# Patient Record
Sex: Male | Born: 1945 | Race: White | Hispanic: No | Marital: Married | Smoking: Former smoker
Health system: Southern US, Community
[De-identification: ages and names within clinical notes are randomized; demographics above are authoritative.]

## PROBLEM LIST (undated history)

## (undated) DIAGNOSIS — M1990 Unspecified osteoarthritis, unspecified site: Secondary | ICD-10-CM

## (undated) DIAGNOSIS — I491 Atrial premature depolarization: Secondary | ICD-10-CM

## (undated) DIAGNOSIS — G4730 Sleep apnea, unspecified: Secondary | ICD-10-CM

## (undated) DIAGNOSIS — Z87442 Personal history of urinary calculi: Secondary | ICD-10-CM

## (undated) DIAGNOSIS — E785 Hyperlipidemia, unspecified: Secondary | ICD-10-CM

## (undated) DIAGNOSIS — K219 Gastro-esophageal reflux disease without esophagitis: Secondary | ICD-10-CM

## (undated) DIAGNOSIS — K469 Unspecified abdominal hernia without obstruction or gangrene: Secondary | ICD-10-CM

## (undated) DIAGNOSIS — Z8719 Personal history of other diseases of the digestive system: Secondary | ICD-10-CM

## (undated) DIAGNOSIS — Z973 Presence of spectacles and contact lenses: Secondary | ICD-10-CM

## (undated) DIAGNOSIS — N201 Calculus of ureter: Secondary | ICD-10-CM

## (undated) DIAGNOSIS — K5730 Diverticulosis of large intestine without perforation or abscess without bleeding: Secondary | ICD-10-CM

## (undated) HISTORY — PX: NASAL SINUS SURGERY: SHX719

## (undated) HISTORY — PX: TONSILLECTOMY: SUR1361

## (undated) HISTORY — DX: Atrial premature depolarization: I49.1

## (undated) HISTORY — DX: Sleep apnea, unspecified: G47.30

## (undated) HISTORY — DX: Unspecified abdominal hernia without obstruction or gangrene: K46.9

## (undated) HISTORY — PX: WRIST SURGERY: SHX841

## (undated) HISTORY — PX: OTHER SURGICAL HISTORY: SHX169

## (undated) HISTORY — DX: Gastro-esophageal reflux disease without esophagitis: K21.9

---

## 1993-02-09 HISTORY — PX: LAPAROSCOPIC NISSEN FUNDOPLICATION: SHX1932

## 1996-02-10 HISTORY — PX: HIATAL HERNIA REPAIR: SHX195

## 2000-05-20 HISTORY — PX: CARDIOVASCULAR STRESS TEST: SHX262

## 2003-06-01 HISTORY — PX: COLONOSCOPY: SHX174

## 2003-12-05 ENCOUNTER — Inpatient Hospital Stay (HOSPITAL_COMMUNITY): Admission: AD | Admit: 2003-12-05 | Discharge: 2003-12-09 | Payer: Self-pay | Admitting: Orthopedic Surgery

## 2003-12-06 HISTORY — PX: OTHER SURGICAL HISTORY: SHX169

## 2005-03-10 ENCOUNTER — Ambulatory Visit: Payer: Self-pay | Admitting: Internal Medicine

## 2005-03-13 ENCOUNTER — Ambulatory Visit: Payer: Self-pay | Admitting: Internal Medicine

## 2005-03-23 ENCOUNTER — Ambulatory Visit: Payer: Self-pay | Admitting: Internal Medicine

## 2005-04-13 ENCOUNTER — Ambulatory Visit: Payer: Self-pay | Admitting: Internal Medicine

## 2006-04-12 ENCOUNTER — Ambulatory Visit: Payer: Self-pay | Admitting: Internal Medicine

## 2006-04-12 LAB — CONVERTED CEMR LAB
AST: 20 units/L (ref 0–37)
Bilirubin, Direct: 0.1 mg/dL (ref 0.0–0.3)
Chloride: 109 meq/L (ref 96–112)
Creatinine, Ser: 1 mg/dL (ref 0.4–1.5)
Eosinophils Relative: 4.8 % (ref 0.0–5.0)
Glucose, Bld: 94 mg/dL (ref 70–99)
HCT: 42.7 % (ref 39.0–52.0)
Hemoglobin: 14.6 g/dL (ref 13.0–17.0)
LDL Cholesterol: 127 mg/dL — ABNORMAL HIGH (ref 0–99)
MCV: 86.8 fL (ref 78.0–100.0)
Monocytes Absolute: 0.4 10*3/uL (ref 0.2–0.7)
Neutrophils Relative %: 51.7 % (ref 43.0–77.0)
PSA: 0.75 ng/mL (ref 0.10–4.00)
RBC: 4.93 M/uL (ref 4.22–5.81)
RDW: 12.6 % (ref 11.5–14.6)
Sodium: 145 meq/L (ref 135–145)
Total Bilirubin: 0.7 mg/dL (ref 0.3–1.2)
Total CHOL/HDL Ratio: 5.2
Total Protein: 6.9 g/dL (ref 6.0–8.3)
WBC: 5.1 10*3/uL (ref 4.5–10.5)

## 2006-11-11 DIAGNOSIS — Z8709 Personal history of other diseases of the respiratory system: Secondary | ICD-10-CM | POA: Insufficient documentation

## 2008-10-03 ENCOUNTER — Telehealth: Payer: Self-pay | Admitting: *Deleted

## 2008-10-10 ENCOUNTER — Ambulatory Visit: Payer: Self-pay | Admitting: Internal Medicine

## 2008-10-10 LAB — CONVERTED CEMR LAB
ALT: 16 units/L (ref 0–53)
AST: 21 units/L (ref 0–37)
Alkaline Phosphatase: 83 units/L (ref 39–117)
Basophils Relative: 0 % (ref 0.0–3.0)
Bilirubin, Direct: 0 mg/dL (ref 0.0–0.3)
Chloride: 108 meq/L (ref 96–112)
Creatinine, Ser: 1 mg/dL (ref 0.4–1.5)
Eosinophils Relative: 3.9 % (ref 0.0–5.0)
LDL Cholesterol: 123 mg/dL — ABNORMAL HIGH (ref 0–99)
Lymphocytes Relative: 34.4 % (ref 12.0–46.0)
MCV: 89.3 fL (ref 78.0–100.0)
Monocytes Absolute: 0.4 10*3/uL (ref 0.1–1.0)
Neutrophils Relative %: 54.5 % (ref 43.0–77.0)
Nitrite: NEGATIVE
Potassium: 3.8 meq/L (ref 3.5–5.1)
RBC: 5.08 M/uL (ref 4.22–5.81)
Total CHOL/HDL Ratio: 5
Total Protein: 7.3 g/dL (ref 6.0–8.3)
Triglycerides: 126 mg/dL (ref 0.0–149.0)
Urobilinogen, UA: 0.2
WBC Urine, dipstick: NEGATIVE
WBC: 6 10*3/uL (ref 4.5–10.5)

## 2008-10-25 ENCOUNTER — Ambulatory Visit: Payer: Self-pay | Admitting: Internal Medicine

## 2008-10-25 DIAGNOSIS — E785 Hyperlipidemia, unspecified: Secondary | ICD-10-CM | POA: Insufficient documentation

## 2009-10-15 ENCOUNTER — Encounter: Payer: Self-pay | Admitting: Internal Medicine

## 2010-03-11 NOTE — Consult Note (Signed)
Summary: [facility]   Imported By: Maryln Gottron 10/22/2009 10:30:12  _____________________________________________________________________  External Attachment:    Type:   Image     Comment:   External Document

## 2010-06-27 NOTE — Op Note (Signed)
NAMEMARICELA, Shawn Graham                 ACCOUNT NO.:  [id number]   MEDICAL RECORD NO.:  [id number]          PATIENT TYPE:  INP   LOCATION:  5727                         FACILITY:  [facility]   PHYSICIAN:  Dionne Ano. Gramig III, M.D.DATE OF BIRTH:  Mar 12, 1945   DATE OF PROCEDURE:  12/06/2003  DATE OF DISCHARGE:                                 OPERATIVE REPORT   PREOPERATIVE DIAGNOSIS:  Status post saw laceration of the left index finger  with nerve and tendon injury as well as early cellulitis process, status  post initial irrigation and debridement 24 hours ago.   POSTOPERATIVE DIAGNOSIS:  Status post saw laceration of the left index  finger with nerve and tendon injury as well as early cellulitis process,  status post initial irrigation and debridement 24 hours ago.   PROCEDURES:  1.  Irrigation and debridement of left index finger skin, subcutaneous      tissue, tendon, and periosteal tissue.  2.  Repair of flexor digitorum profundus in zone 2, left index finger, with      four-strand 4-0 Fibrewire technique.  3.  Debride flexor digitorum superficialis, zone 2, left index finger.  4.  Left index finger ulnar digital nerve repair.  5.  Left index finger radial digital nerve repair.  Both digital nerves were      repaired in zone 2, I should note.  6.  Microscope use for the digital nerve repairs.   SURGEON:  Dionne Ano. Amanda Pea, M.D.   ASSISTANT:  Karie Chimera, P.A.-C.   COMPLICATIONS:  None.   ANESTHESIA:  General.   TOURNIQUET TIME:  Less than 45 minutes.   CULTURES:  Aerobic and anaerobic taken.   INDICATION FOR PROCEDURE:  This patient is a 65 year old male who presents  with the above-mentioned diagnosis.  He was seen at [facility] and  underwent closure of his wound Monday.  He presented to my office Wednesday  with worsening cellulitis and inability to move the finger.  He was I&D'd  immediately and cultures were taken, and he was admitted to the hospital.  Subsequent to this the patient presents for repeat I&D and repair of all  structures as necessary.  He understands all risks and benefits, etc.   OPERATION IN DETAIL:  The patient was seen by myself and anesthesia, taken  to the operative suite, and underwent a smooth induction of general  anesthesia.  He was noted to be on Unasyn preoperatively as ordered by  myself.  Once in the operative suite he underwent prep and drape in the  usual sterile fashion about the left upper extremity with Betadine scrub and  paint.  The patient had the sterile field secured with drapes and once this  was done underwent I&D of skin, subcutaneous tissue, tendon tissue, and  periosteal tissue.  He had severe injury to the A4 pulley.  Periosteal  tissue was involved, but there was no bony fracture.  The patient had a  radial and ulnar digital nerve laceration.  He did have refill to the finger  preoperatively and postoperatively.  He had extensive FDP tearing  with  retraction of the tendon and in addition this, FDS was frayed.  He underwent  debridement without difficulty, and copious amounts of saline were placed in  the wound.  I did irrigate the sheath rather vigorously without difficulty.  Following this the patient then had debridement of the flexor digitorum  superficialis in zone 2 about the left index finger.  Following this he  underwent a four-strand repair with 4-0 Fibrewire in zone 2 about the flexor  digitorum profundus tendon.  A 6-0 nylon was used for an epitenon repair.  The A4 pulley was completely obliterated and torn.  The A2 pulley was  intact.  The A4 pulley was then able to be reconstructed as it had a large  amount of significant loss.  Following this, the patient then underwent  evaluation of the digital nerves.  The microscope was brought in and the  patient underwent left index finger ulnar digital nerve repair with  epineurial technique.  The patient then underwent radial digital  nerve  repair at the DIP region in the region of the trifurcation of the nerve.  This was once again done with 8-0 nylon in an epineurial fashion.  Both the  radial and ulnar digital nerves were repaired under the microscope without  difficulty, and there were no complicating features.  Once the repairs were  accomplished, the patient then had further copious irrigation.  The patient  had the digital nerve repairs and flexor tendon repair done with the  tourniquet down to ensure that his refill was adequate.  I was pleased with  this.  Following this I then debrided any prenecrotic tissue and then closed  the wound over a drain.  A vessel loop drain was placed and Prolene suture  was used to loosely close the wound.  No tendon or nerve repairs were  exposed to the outside environment, and the finger closed relatively well.  I was pleased with this and the findings.  Given the fact that he had no  frank pus and improving findings, we did feel it necessary to go ahead and  perform the nerve and tendon repairs.  I have discussed this with his wife.   I feel he has a long road ahead of him, but certainly we will be aggressive  in trying to restore him to quiescence.  I have given them no guarantees,  and we will need to monitor the wound closely.  We did take aerobic and  anaerobic cultures at the outset of the case.  These were sent.  His  tourniquet time was certainly less than 45 minutes.  There were no  complicating features.  He had good refill and had a sterile dressing with  dorsal blocking splint applied at the end of the case.  He was transferred  to the recovery room in stable condition.  All sponge, needle, and  instrument counts were reported as correct.  He will be monitored and  continued as an inpatient in the hospital.  I have discussed all care issues  and plans with his wife.       WMG/MEDQ  D:  12/06/2003  T:  12/07/2003  Job:  [id number]

## 2010-06-27 NOTE — Discharge Summary (Signed)
NAMEKEITH, FELTEN NO.:  [id number]   MEDICAL RECORD NO.:  [id number]          PATIENT TYPE:  INP   LOCATION:  5025                         FACILITY:  [facility]   PHYSICIAN:  Karie Chimera, P.A.-C.DATE OF BIRTH:  [birth year]   DATE OF ADMISSION:  12/05/2003  DATE OF DISCHARGE:  12/09/2003                                 DISCHARGE SUMMARY   ADMISSION DIAGNOSIS:  Left index finger laceration sustaining FDP, FDS  injury as well as neurological deficit with ascending cellulitis noted.   DISCHARGE DIAGNOSIS:  Left index finger laceration sustaining FDP, FDS  injury as well as neurological deficit with ascending cellulitis noted.   SURGEON:  Dionne Ano. Amanda Pea, M.D.   CONSULTATIONS:  None.   BRIEF HISTORY PRESENT ILLNESS:  Mr. Lindholm was a pleasant 65 year old  gentleman who sustained a laceration to his left index finger on Monday  November 24.  He was originally seen and evaluated at the [facility]  Emergency Room where he was placed on antibiotics and pain medications.  The  patient noted lack of sensation to the tip of the finger as well as lack of  motion.  Over the next two days, he noticed increased swelling, redness, and  pain and was seen and evaluated by Dr. Amanda Pea and upper extremity  specialist.  He was found to have a left index finger laceration with  neurological deficit as well as tendon deficit and soft tissue infection.  Given the nature of his injury, the patient was admitted for an I&D and  repair of structures as necessary.  It was noted the patient had been  previously placed on Keflex which would certainly__________the  intraoperative cultures.   HOSPITAL COURSE:  The patient was admitted on December 05, 2003 and was  started on Unasyn 3 g IV q.6h as well as appropriate pain management.  He  was elevated in a mission sling and monitored carefully.  Once soft tissue  conditions improved somewhat or were more improved, on December 06, 2003  the  patient underwent an I&D of the left index finger as well as repair of the  FDPs zone 2, debridement of FDS zone 2.  He underwent a left index finger  ulnar digital nerve and radial digital nerve repair utilizing the  microscope.  Intraoperative cultures were obtained.  The patient was  continued to watch closely.  On December 07, 2003, his vital signs were  stable.  Temperature max was 99.8.  His dressings were clean, dry, and  intact.  He continued receiving IV antibiotics and appropriate pain  management.  On postoperative day #2, he was alert and oriented.  Vital  signs were stable.  Cultures showed no growth to date.  Temperature max was  99.1.  The patient overall continued to improve and on December 09, 2003 his  dressings were removed.  He had noted improvement in his soft tissue  swelling and erythema.  He had excellent refill noted.  His dressings were  changed at bedside.  His vital signs were stable.  His cultures showed no  growth to date.  Due to the patient's improvement, a decision was made to  discharge him home.   ADMISSION DIAGNOSIS:  Left index finger laceration with tendinous  neurological deficit and soft tissue cellulitis.   CONDITION ON DISCHARGE:  Improved.   DISPOSITION:  Home to care of family.   ACTIVITY:  He will keep his dressings clean, dry, and intact.   DISCHARGE MEDICATIONS:  1.  Percocet #50, 1-2 p.o. q.4-6h p.r.n. pain.  2.  Robaxin #30, one p.o. q.6-8h with one refill.  3.  Augmentin 875 one p.o. b.i.d. x2 weeks.   In terms of followup, the patient will call the office tomorrow to arrange  therapy and followup with Dr. Amanda Pea.       BB/MEDQ  D:  01/15/2004  T:  01/15/2004  Job:  [id number]

## 2013-04-30 ENCOUNTER — Emergency Department (HOSPITAL_COMMUNITY): Payer: Medicare Other

## 2013-04-30 ENCOUNTER — Encounter (HOSPITAL_COMMUNITY): Payer: Medicare Other | Admitting: Anesthesiology

## 2013-04-30 ENCOUNTER — Encounter (HOSPITAL_COMMUNITY): Payer: Self-pay | Admitting: Emergency Medicine

## 2013-04-30 ENCOUNTER — Emergency Department (HOSPITAL_COMMUNITY): Payer: Medicare Other | Admitting: Anesthesiology

## 2013-04-30 ENCOUNTER — Inpatient Hospital Stay (HOSPITAL_COMMUNITY)
Admission: EM | Admit: 2013-04-30 | Discharge: 2013-05-01 | DRG: 669 | Disposition: A | Discharge status: Home / Self Care | Payer: Medicare Other | Attending: Urology | Admitting: Urology

## 2013-04-30 ENCOUNTER — Encounter (HOSPITAL_COMMUNITY)
Admission: EM | Disposition: A | Discharge status: Home / Self Care | Payer: Self-pay | Source: Home / Self Care | Attending: Urology

## 2013-04-30 DIAGNOSIS — N329 Bladder disorder, unspecified: Secondary | ICD-10-CM | POA: Diagnosis present

## 2013-04-30 DIAGNOSIS — N201 Calculus of ureter: Principal | ICD-10-CM | POA: Diagnosis present

## 2013-04-30 DIAGNOSIS — Z87891 Personal history of nicotine dependence: Secondary | ICD-10-CM

## 2013-04-30 DIAGNOSIS — N179 Acute kidney failure, unspecified: Secondary | ICD-10-CM | POA: Diagnosis present

## 2013-04-30 DIAGNOSIS — N390 Urinary tract infection, site not specified: Secondary | ICD-10-CM | POA: Diagnosis present

## 2013-04-30 DIAGNOSIS — R509 Fever, unspecified: Secondary | ICD-10-CM

## 2013-04-30 HISTORY — PX: CYSTOSCOPY W/ URETERAL STENT PLACEMENT: SHX1429

## 2013-04-30 LAB — URINALYSIS, ROUTINE W REFLEX MICROSCOPIC
BILIRUBIN URINE: NEGATIVE
Glucose, UA: NEGATIVE mg/dL
KETONES UR: 15 mg/dL — AB
Nitrite: NEGATIVE
PROTEIN: 100 mg/dL — AB
Specific Gravity, Urine: 1.019 (ref 1.005–1.030)
UROBILINOGEN UA: 1 mg/dL (ref 0.0–1.0)
pH: 6 (ref 5.0–8.0)

## 2013-04-30 LAB — CBC WITH DIFFERENTIAL/PLATELET
BASOS ABS: 0 10*3/uL (ref 0.0–0.1)
Basophils Relative: 0 % (ref 0–1)
EOS PCT: 2 % (ref 0–5)
Eosinophils Absolute: 0.2 10*3/uL (ref 0.0–0.7)
HCT: 37.6 % — ABNORMAL LOW (ref 39.0–52.0)
Hemoglobin: 12.8 g/dL — ABNORMAL LOW (ref 13.0–17.0)
Lymphocytes Relative: 17 % (ref 12–46)
Lymphs Abs: 1.6 10*3/uL (ref 0.7–4.0)
MCH: 29.4 pg (ref 26.0–34.0)
MCHC: 34 g/dL (ref 30.0–36.0)
MCV: 86.4 fL (ref 78.0–100.0)
Monocytes Absolute: 1 10*3/uL (ref 0.1–1.0)
Monocytes Relative: 10 % (ref 3–12)
Neutro Abs: 7 10*3/uL (ref 1.7–7.7)
Neutrophils Relative %: 71 % (ref 43–77)
PLATELETS: 185 10*3/uL (ref 150–400)
RBC: 4.35 MIL/uL (ref 4.22–5.81)
RDW: 12.6 % (ref 11.5–15.5)
WBC: 9.8 10*3/uL (ref 4.0–10.5)

## 2013-04-30 LAB — URINE MICROSCOPIC-ADD ON

## 2013-04-30 LAB — BASIC METABOLIC PANEL
BUN: 17 mg/dL (ref 6–23)
CALCIUM: 9.1 mg/dL (ref 8.4–10.5)
CO2: 24 mEq/L (ref 19–32)
Chloride: 102 mEq/L (ref 96–112)
Creatinine, Ser: 1.79 mg/dL — ABNORMAL HIGH (ref 0.50–1.35)
GFR calc Af Amer: 43 mL/min — ABNORMAL LOW (ref >90)
GFR calc non Af Amer: 37 mL/min — ABNORMAL LOW (ref >90)
Glucose, Bld: 108 mg/dL — ABNORMAL HIGH (ref 70–99)
Potassium: 4 mEq/L (ref 3.7–5.3)
SODIUM: 140 meq/L (ref 137–147)

## 2013-04-30 SURGERY — CYSTOSCOPY, WITH RETROGRADE PYELOGRAM AND URETERAL STENT INSERTION
Anesthesia: General | Laterality: Right

## 2013-04-30 MED ORDER — DEXAMETHASONE SODIUM PHOSPHATE 10 MG/ML IJ SOLN
INTRAMUSCULAR | Status: DC | PRN
  Administered 2013-04-30: 10 mg via INTRAVENOUS

## 2013-04-30 MED ORDER — DEXTROSE 5 % IV SOLN: 1.0000 g | INTRAVENOUS | Status: DC

## 2013-04-30 MED ORDER — FENTANYL CITRATE 0.05 MG/ML IJ SOLN
INTRAMUSCULAR | Status: DC | PRN
  Administered 2013-04-30 (×2): 50 ug via INTRAVENOUS

## 2013-04-30 MED ORDER — MIDAZOLAM HCL 2 MG/2ML IJ SOLN
INTRAMUSCULAR | Status: AC
  Filled 2013-04-30: qty 2

## 2013-04-30 MED ORDER — SODIUM CHLORIDE 0.9 % IR SOLN
Status: DC | PRN
  Administered 2013-04-30: 3000 mL

## 2013-04-30 MED ORDER — SENNA 8.6 MG PO TABS
1.0000 | ORAL_TABLET | Freq: Two times a day (BID) | ORAL | Status: DC
  Administered 2013-04-30: 8.6 mg via ORAL
  Filled 2013-04-30: qty 1

## 2013-04-30 MED ORDER — KETOROLAC TROMETHAMINE 30 MG/ML IJ SOLN: 15.0000 mg | Freq: Once | INTRAMUSCULAR | Status: DC | PRN

## 2013-04-30 MED ORDER — PROPOFOL 10 MG/ML IV BOLUS
INTRAVENOUS | Status: AC
  Filled 2013-04-30: qty 20

## 2013-04-30 MED ORDER — DEXTROSE 5 % IV SOLN
1.0000 g | Freq: Once | INTRAVENOUS | Status: AC
  Administered 2013-04-30: 1 g via INTRAVENOUS
  Filled 2013-04-30: qty 10

## 2013-04-30 MED ORDER — MEPERIDINE HCL 50 MG/ML IJ SOLN: 6.2500 mg | INTRAMUSCULAR | Status: DC | PRN

## 2013-04-30 MED ORDER — PROPOFOL 10 MG/ML IV BOLUS
INTRAVENOUS | Status: DC | PRN
  Administered 2013-04-30: 200 mg via INTRAVENOUS
  Administered 2013-04-30: 50 mg via INTRAVENOUS

## 2013-04-30 MED ORDER — STERILE WATER FOR IRRIGATION IR SOLN
Status: DC | PRN
  Administered 2013-04-30: 3000 mL

## 2013-04-30 MED ORDER — DOCUSATE SODIUM 100 MG PO CAPS
100.0000 mg | ORAL_CAPSULE | Freq: Two times a day (BID) | ORAL | Status: DC
  Administered 2013-04-30 – 2013-05-01 (×2): 100 mg via ORAL
  Filled 2013-04-30 (×3): qty 1

## 2013-04-30 MED ORDER — OXYCODONE HCL 5 MG PO TABS: 5.0000 mg | ORAL_TABLET | ORAL | Status: DC | PRN

## 2013-04-30 MED ORDER — ONDANSETRON HCL 4 MG/2ML IJ SOLN
INTRAMUSCULAR | Status: DC | PRN
  Administered 2013-04-30: 4 mg via INTRAVENOUS

## 2013-04-30 MED ORDER — DEXAMETHASONE SODIUM PHOSPHATE 10 MG/ML IJ SOLN
INTRAMUSCULAR | Status: AC
  Filled 2013-04-30: qty 1

## 2013-04-30 MED ORDER — LORATADINE 10 MG PO TABS
10.0000 mg | ORAL_TABLET | Freq: Every day | ORAL | Status: DC
  Administered 2013-04-30: 10 mg via ORAL
  Filled 2013-04-30 (×2): qty 1

## 2013-04-30 MED ORDER — ONDANSETRON HCL 4 MG/2ML IJ SOLN: 4.0000 mg | INTRAMUSCULAR | Status: DC | PRN

## 2013-04-30 MED ORDER — HYDROMORPHONE HCL PF 1 MG/ML IJ SOLN: 0.5000 mg | INTRAMUSCULAR | Status: DC | PRN

## 2013-04-30 MED ORDER — SUCCINYLCHOLINE CHLORIDE 20 MG/ML IJ SOLN
INTRAMUSCULAR | Status: DC | PRN
  Administered 2013-04-30: 100 mg via INTRAVENOUS

## 2013-04-30 MED ORDER — METOCLOPRAMIDE HCL 5 MG/ML IJ SOLN: 10.0000 mg | Freq: Once | INTRAMUSCULAR | Status: DC | PRN

## 2013-04-30 MED ORDER — IOHEXOL 300 MG/ML  SOLN
INTRAMUSCULAR | Status: DC | PRN
  Administered 2013-04-30: 50 mL via URETHRAL

## 2013-04-30 MED ORDER — TAMSULOSIN HCL 0.4 MG PO CAPS
0.4000 mg | ORAL_CAPSULE | Freq: Every day | ORAL | Status: DC
  Administered 2013-04-30 – 2013-05-01 (×2): 0.4 mg via ORAL
  Filled 2013-04-30 (×2): qty 1

## 2013-04-30 MED ORDER — SODIUM CHLORIDE 0.9 % IV SOLN
INTRAVENOUS | Status: DC
  Administered 2013-04-30: 21:00:00 via INTRAVENOUS

## 2013-04-30 MED ORDER — LACTATED RINGERS IV SOLN
INTRAVENOUS | Status: DC | PRN
  Administered 2013-04-30: 20:00:00 via INTRAVENOUS

## 2013-04-30 MED ORDER — FENTANYL CITRATE 0.05 MG/ML IJ SOLN: 25.0000 ug | INTRAMUSCULAR | Status: DC | PRN

## 2013-04-30 MED ORDER — ONDANSETRON HCL 4 MG/2ML IJ SOLN
INTRAMUSCULAR | Status: AC
  Filled 2013-04-30: qty 2

## 2013-04-30 MED ORDER — SODIUM CHLORIDE 0.9 % IV BOLUS (SEPSIS)
1000.0000 mL | Freq: Once | INTRAVENOUS | Status: AC
  Administered 2013-04-30: 1000 mL via INTRAVENOUS

## 2013-04-30 MED ORDER — FENTANYL CITRATE 0.05 MG/ML IJ SOLN
INTRAMUSCULAR | Status: AC
  Filled 2013-04-30: qty 2

## 2013-04-30 MED ORDER — MIDAZOLAM HCL 5 MG/5ML IJ SOLN
INTRAMUSCULAR | Status: DC | PRN
  Administered 2013-04-30: 2 mg via INTRAVENOUS

## 2013-04-30 MED ORDER — LIDOCAINE HCL (CARDIAC) 20 MG/ML IV SOLN
INTRAVENOUS | Status: DC | PRN
  Administered 2013-04-30: 100 mg via INTRAVENOUS

## 2013-04-30 SURGICAL SUPPLY — 11 items
CATH URET 5FR 28IN OPEN ENDED (CATHETERS) ×2 IMPLANT
DRAPE CAMERA CLOSED 9X96 (DRAPES) ×2 IMPLANT
GLOVE BIO SURGEON STRL SZ7.5 (GLOVE) ×2 IMPLANT
GLOVE SURG SS PI 8.5 STRL IVOR (GLOVE) ×4
GLOVE SURG SS PI 8.5 STRL STRW (GLOVE) IMPLANT
GOWN STRL REUS W/TWL 2XL LVL3 (GOWN DISPOSABLE) ×2 IMPLANT
GOWN STRL REUS W/TWL XL LVL3 (GOWN DISPOSABLE) ×2 IMPLANT
GUIDEWIRE ANG ZIPWIRE 038X150 (WIRE) ×2 IMPLANT
GUIDEWIRE STR DUAL SENSOR (WIRE) ×2 IMPLANT
PACK CYSTO (CUSTOM PROCEDURE TRAY) ×2 IMPLANT
STENT POLARIS 5FRX26 (STENTS) ×2 IMPLANT

## 2013-04-30 NOTE — ED Notes (Signed)
Pt given urinal and made aware of need for urine specimen 

## 2013-04-30 NOTE — ED Notes (Signed)
Pt told has kidney stone on Wed and saw urologist and was told he has until Friday to pass it. Pt started feeling horrible today with fevers and called urologist on call and was told to come in to ED and he would come and see. Possibly place stent today.

## 2013-04-30 NOTE — Anesthesia Postprocedure Evaluation (Signed)
  Anesthesia Post-op Note  Anesthesia Post Note  Patient: Shawn Graham  Procedure(s) Performed: Procedure(s) (LRB): CYSTOSCOPY WITH RETROGRADE PYELOGRAM/URETERAL STENT PLACEMENT (Right)  Anesthesia type: General  Patient location: PACU  Post pain: Pain level controlled  Post assessment: Post-op Vital signs reviewed  Last Vitals:  Filed Vitals:   04/30/13 2030  BP: 122/67  Pulse: 80  Temp:   Resp: 19    Post vital signs: Reviewed  Level of consciousness: sedated  Complications: No apparent anesthesia complications

## 2013-04-30 NOTE — Anesthesia Preprocedure Evaluation (Addendum)
Anesthesia Evaluation  Patient identified by MRN, date of birth, ID band Patient awake    Reviewed: Allergy & Precautions, H&P , NPO status , Patient's Chart, lab work & pertinent test results, reviewed documented beta blocker date and time   History of Anesthesia Complications Negative for: history of anesthetic complications  Airway Mallampati: I TM Distance: >3 FB Neck ROM: full    Dental  (+) Teeth Intact   Pulmonary neg pulmonary ROS, former smoker,  breath sounds clear to auscultation  Pulmonary exam normal       Cardiovascular Exercise Tolerance: Good negative cardio ROS  Rhythm:regular Rate:Normal     Neuro/Psych negative neurological ROS  negative psych ROS   GI/Hepatic negative GI ROS, Neg liver ROS,   Endo/Other  negative endocrine ROS  Renal/GU Renal stone - taking flomax and oxycodone since Wednesday.  Today with fever to 101.7  negative genitourinary   Musculoskeletal   Abdominal   Peds  Hematology negative hematology ROS (+)   Anesthesia Other Findings Last ate yogurt at 1 pm today  Reproductive/Obstetrics negative OB ROS                          Anesthesia Physical Anesthesia Plan  ASA: II and emergent  Anesthesia Plan: General ETT   Post-op Pain Management:    Induction:   Airway Management Planned:   Additional Equipment:   Intra-op Plan:   Post-operative Plan:   Informed Consent: I have reviewed the patients History and Physical, chart, labs and discussed the procedure including the risks, benefits and alternatives for the proposed anesthesia with the patient or authorized representative who has indicated his/her understanding and acceptance.   Dental Advisory Given  Plan Discussed with: CRNA and Surgeon  Anesthesia Plan Comments:        Anesthesia Quick Evaluation

## 2013-04-30 NOTE — H&P (Signed)
Shawn Graham is an 68 y.o. male.    Chief Complaint: Rt Ureteral Stone, Flank Pain, Fever, Acute Renal Failure  HPI:   1 - Rt Ureteral Stone, Flank Pain, Fever, Acute Renal Failure -  Pt with Rt ureteral stone 69mm proximal by CT at [facility] (report avail, images not) on w/u colicky flank pain on 9/37/16. Seen by [facility] at [facility] 3/20 with tentative plan for initial medical therapy. Subsequently developed fevers today to 101.5 with diaphoresis, severe malaise at home and febrile in ER to 100.8. No other localizing symptoms. Cr today in ER 1.78, unknown baseline, but denies renal insufficiency. UA from 3/20 w/o infectious parameters. UA today not collected but pt already started on rocephin.  Last meal bit of yogurt at 1pm. KUB today unable to visualize stone due to bowel gas pattern, but he denies interval passage.  PMH sig for hiateal hernia repair, sinus surgery. No CV disease. No strong blood thinners.   Today Shawn Graham is seen as ER visit urgently for above.   History reviewed. No pertinent past medical history.  Past Surgical History  Procedure Laterality Date  . Hernia repair    . Nasal sinus surgery      No family history on file. Social History:  reports that he has quit smoking. He does not have any smokeless tobacco history on file. He reports that he does not drink alcohol. His drug history is not on file.  Allergies: No Known Allergies   (Not in a hospital admission)  Results for orders placed during the hospital encounter of 04/30/13 (from the past 48 hour(s))  CBC WITH DIFFERENTIAL     Status: Abnormal   Collection Time    04/30/13  5:30 PM      Result Value Ref Range   WBC 9.8  4.0 - 10.5 K/uL   RBC 4.35  4.22 - 5.81 MIL/uL   Hemoglobin 12.8 (*) 13.0 - 17.0 g/dL   HCT 37.6 (*) 39.0 - 52.0 %   MCV 86.4  78.0 - 100.0 fL   MCH 29.4  26.0 - 34.0 pg   MCHC 34.0  30.0 - 36.0 g/dL   RDW 12.6  11.5 - 15.5 %   Platelets 185  150 - 400 K/uL   Neutrophils Relative %  71  43 - 77 %   Neutro Abs 7.0  1.7 - 7.7 K/uL   Lymphocytes Relative 17  12 - 46 %   Lymphs Abs 1.6  0.7 - 4.0 K/uL   Monocytes Relative 10  3 - 12 %   Monocytes Absolute 1.0  0.1 - 1.0 K/uL   Eosinophils Relative 2  0 - 5 %   Eosinophils Absolute 0.2  0.0 - 0.7 K/uL   Basophils Relative 0  0 - 1 %   Basophils Absolute 0.0  0.0 - 0.1 K/uL  BASIC METABOLIC PANEL     Status: Abnormal   Collection Time    04/30/13  5:30 PM      Result Value Ref Range   Sodium 140  137 - 147 mEq/L   Potassium 4.0  3.7 - 5.3 mEq/L   Chloride 102  96 - 112 mEq/L   CO2 24  19 - 32 mEq/L   Glucose, Bld 108 (*) 70 - 99 mg/dL   BUN 17  6 - 23 mg/dL   Creatinine, Ser 1.79 (*) 0.50 - 1.35 mg/dL   Calcium 9.1  8.4 - 10.5 mg/dL   GFR calc non Af Amer 37 (*) >  90 mL/min   GFR calc Af Amer 43 (*) >90 mL/min   Comment: (NOTE)     The eGFR has been calculated using the CKD EPI equation.     This calculation has not been validated in all clinical situations.     eGFR's persistently <90 mL/min signify possible Chronic Kidney     Disease.   Dg Chest 2 View  04/30/2013   CLINICAL DATA:  Right lower quadrant and pelvic pain, fever, recent surgery for kidney stone, history hiatal hernia  EXAM: CHEST  2 VIEW  COMPARISON:  None  FINDINGS: Borderline enlargement of cardiac silhouette.  Moderate-sized hiatal hernia.  Numerous surgical clips adjacent to hiatal hernia and proximal stomach.  Mediastinal contours and pulmonary vascularity otherwise normal.  Peribronchial thickening without infiltrate, pleural effusion or pneumothorax.  Bones demineralized.  Mild superior endplate compression deformity of a mid to lower thoracic vertebra, appears old.  IMPRESSION: Bronchitic changes.  Borderline enlargement of cardiac silhouette.  Moderate-sized hiatal hernia.   Electronically Signed   By: Lavonia Dana M.D.   On: 04/30/2013 18:21    Review of Systems  Constitutional: Positive for fever, chills and malaise/fatigue.  HENT: Negative.    Eyes: Negative.   Respiratory: Negative.   Cardiovascular: Negative.   Gastrointestinal: Positive for nausea. Negative for vomiting.  Genitourinary: Positive for flank pain.  Musculoskeletal: Negative.   Skin: Negative.   Neurological: Negative.   Endo/Heme/Allergies: Negative.   Psychiatric/Behavioral: Negative.     Blood pressure 128/76, pulse 78, temperature 100.1 F (37.8 C), temperature source Oral, resp. rate 14, SpO2 98.00%. Physical Exam  Constitutional: He is oriented to person, place, and time. He appears well-developed and well-nourished.  Very pleasant, wife at bedside  HENT:  Head: Normocephalic and atraumatic.  Neck: Normal range of motion. Neck supple.  Cardiovascular: Normal rate and regular rhythm.   Respiratory: Effort normal.  GI: Soft. Bowel sounds are normal.  Moderate truncal obesity  Genitourinary: Penis normal.  Mild Rt CVAT  Musculoskeletal: Normal range of motion.  Neurological: He is alert and oriented to person, place, and time.  Skin: Skin is warm and dry.  Psychiatric: He has a normal mood and affect. His behavior is normal. Judgment and thought content normal.     Assessment/Plan  1 - Rt Ureteral Stone, Flank Pain, Fever, Acute Renal Failure - High suspicion for obstructed pyelonephritis. Discussed goal of urgent renal decompression, preferably with Rt ureteral stent, with subsequent stone management pending resolution of infection. He wants to proceed with stent today. Risks including bleeding, infection, failure to place / need for nephrostomy, as well as rare risks such as DVT, PE, MI, CVA, Mortality discussed.   Will admit post-op and keep on IV rocephin. Will notify Dr. Gaynelle Arabian of pt admission. Will obtain new CX from OR.    Shawn Graham 04/30/2013, 6:24 PM

## 2013-04-30 NOTE — Transfer of Care (Signed)
Immediate Anesthesia Transfer of Care Note  Patient: Shawn Graham  Procedure(s) Performed: Procedure(s): CYSTOSCOPY WITH RETROGRADE PYELOGRAM/URETERAL STENT PLACEMENT (Right)  Patient Location: PACU  Anesthesia Type:General  Level of Consciousness: sedated  Airway & Oxygen Therapy: Patient Spontanous Breathing and Patient connected to face mask oxygen  Post-op Assessment: Report given to PACU RN and Post -op Vital signs reviewed and stable  Post vital signs: Reviewed and stable  Complications: No apparent anesthesia complications

## 2013-04-30 NOTE — Brief Op Note (Signed)
04/30/2013  8:07 PM  PATIENT:  Shawn Graham  68 y.o. male  PRE-OPERATIVE DIAGNOSIS:  ureteral stone  POST-OPERATIVE DIAGNOSIS:  * No post-op diagnosis entered *  PROCEDURE:  Procedure(s): CYSTOSCOPY WITH RETROGRADE PYELOGRAM/URETERAL STENT PLACEMENT (Right), Bladder Biopsy with Fulgeration  SURGEON:  Surgeon(s) and Role:    * Alexis Frock, MD - Primary  PHYSICIAN ASSISTANT:   ASSISTANTS: none   ANESTHESIA:   general  EBL:     BLOOD ADMINISTERED:none  DRAINS: none   LOCAL MEDICATIONS USED:  NONE  SPECIMEN:  Source of Specimen:  1 - Small posterior bladder mass (sessile)  DISPOSITION OF SPECIMEN:  PATHOLOGY  COUNTS:  YES  TOURNIQUET:  * No tourniquets in log *  DICTATION: .Other Dictation: Dictation Number [id number]  PLAN OF CARE: Admit to inpatient   PATIENT DISPOSITION:  PACU - hemodynamically stable.   Delay start of Pharmacological VTE agent (>24hrs) due to surgical blood loss or risk of bleeding: not applicable

## 2013-04-30 NOTE — ED Notes (Signed)
Pt unable to void at this time. 

## 2013-04-30 NOTE — Preoperative (Signed)
Beta Blockers   Reason not to administer Beta Blockers:Not Applicable 

## 2013-04-30 NOTE — ED Provider Notes (Signed)
CSN: [id number]     Arrival date & time 04/30/13  75 History   First MD Initiated Contact with Patient 04/30/13 1655     Chief Complaint  Patient presents with  . Nephrolithiasis  . Fever     (Consider location/radiation/quality/duration/timing/severity/associated sxs/prior Treatment) HPI 68 year old male who comes in today complaining of right flank pain and fever. He began having right flank pain on Wednesday and was seen at [facility]. He states he was told he had a kidney stone. He followed up with Dr. Gaynelle Arabian on Friday. He reports he had a 5 mm stone in the right ureter. He has been doing okay with pain medicine until today when the pain worsened and he began running a fever. He does not report any urinary tract infection symptoms. He has had some decreased urine output. He states he spoke with the urologist on call and was told to come to the emergency department for probable stent placement stay. He denies nausea, vomiting, chills, cough, or weakness. He has no significant past medical history. He has not been on antibiotics and has been taking Percocet for pain. Reports the pain currently in the right flank at 2/10. History reviewed. No pertinent past medical history. Past Surgical History  Procedure Laterality Date  . Hernia repair    . Nasal sinus surgery     No family history on file. History  Substance Use Topics  . Smoking status: Former Research scientist (life sciences)  . Smokeless tobacco: Not on file  . Alcohol Use: No    Review of Systems  All other systems reviewed and are negative.      Allergies  Review of patient's allergies indicates no known allergies.  Home Medications   Current Outpatient Rx  Name  Route  Sig  Dispense  Refill  . cetirizine (ZYRTEC) 10 MG tablet   Oral   Take 10 mg by mouth daily.         Marland Kitchen ketorolac (TORADOL) 10 MG tablet   Oral   Take 10 mg by mouth every 8 (eight) hours as needed for moderate pain or severe pain.          . Multiple  Vitamin (MULTIVITAMIN WITH MINERALS) TABS tablet   Oral   Take 1 tablet by mouth daily.         Marland Kitchen oxyCODONE (OXY IR/ROXICODONE) 5 MG immediate release tablet   Oral   Take 5-10 mg by mouth every 6 (six) hours as needed for moderate pain or severe pain.          . promethazine (PHENERGAN) 25 MG tablet   Oral   Take 25 mg by mouth every 4 (four) hours as needed for nausea or vomiting.          . tamsulosin (FLOMAX) 0.4 MG CAPS capsule   Oral   Take 0.4 mg by mouth daily.           BP 142/72  Pulse 87  Temp(Src) 100.1 F (37.8 C) (Oral)  Resp 19  SpO2 96% Physical Exam  Nursing note and vitals reviewed. Constitutional: He is oriented to person, place, and time. He appears well-developed and well-nourished.  HENT:  Head: Normocephalic and atraumatic.  Right Ear: External ear normal.  Left Ear: External ear normal.  Nose: Nose normal.  Mouth/Throat: Oropharynx is clear and moist.  Eyes: Conjunctivae and EOM are normal. Pupils are equal, round, and reactive to light.  Neck: Normal range of motion. Neck supple.  Cardiovascular: Normal rate, regular rhythm,  normal heart sounds and intact distal pulses.   Pulmonary/Chest: Effort normal and breath sounds normal. No respiratory distress. He has no wheezes. He exhibits no tenderness.  Abdominal: Soft. Bowel sounds are normal. He exhibits no distension and no mass. There is no tenderness. There is no guarding.  Musculoskeletal: Normal range of motion.  Neurological: He is alert and oriented to person, place, and time. He has normal reflexes. He exhibits normal muscle tone. Coordination normal.  Skin: Skin is warm and dry.  Psychiatric: He has a normal mood and affect. His behavior is normal. Judgment and thought content normal.    ED Course  Procedures (including critical care time) Labs Review Labs Reviewed  CULTURE, BLOOD (ROUTINE X 2)  CULTURE, BLOOD (ROUTINE X 2)  URINALYSIS, ROUTINE W REFLEX MICROSCOPIC  CBC WITH  DIFFERENTIAL  BASIC METABOLIC PANEL   Imaging Review No results found.   EKG Interpretation None     Patient care discussed with Dr. Tresa Moore  MDM   Final diagnoses:  Ureterolithiasis  Fever    Dr. Tresa Moore at bedside.  Patient unable to void- no urine obtained at this time.      Shaune Pollack, MD 04/30/13 [id number]

## 2013-05-01 ENCOUNTER — Other Ambulatory Visit: Payer: Self-pay | Admitting: Urology

## 2013-05-01 ENCOUNTER — Encounter (HOSPITAL_COMMUNITY): Payer: Self-pay | Admitting: Urology

## 2013-05-01 MED ORDER — CIPROFLOXACIN HCL 500 MG PO TABS
500.0000 mg | ORAL_TABLET | Freq: Two times a day (BID) | ORAL | Status: DC
  Administered 2013-05-01: 500 mg via ORAL
  Filled 2013-05-01 (×3): qty 1

## 2013-05-01 MED ORDER — PHENAZOPYRIDINE HCL 200 MG PO TABS: 200.0000 mg | ORAL_TABLET | Freq: Three times a day (TID) | ORAL | Status: DC | PRN

## 2013-05-01 MED ORDER — CIPROFLOXACIN HCL 500 MG PO TABS: 500.0000 mg | ORAL_TABLET | Freq: Two times a day (BID) | ORAL | Status: DC

## 2013-05-01 MED ORDER — OXYCODONE-ACETAMINOPHEN 5-325 MG PO TABS: 1.0000 | ORAL_TABLET | Freq: Four times a day (QID) | ORAL | Status: DC | PRN

## 2013-05-01 NOTE — Discharge Instructions (Signed)
Diet for Kidney Stones Kidney stones are small, hard masses that form inside your kidneys. They are made up of salts and minerals and often form when high levels build up in the urine. The minerals can then start to build up, crystalize, and stick together to form stones. There are several different types of kidney stones. The following types of stones may be influenced by dietary factors:   Calcium Oxalate Stones. An oxalate is a salt found in certain foods. Within the body, calcium can combine with oxalates to form calcium oxalate stones, which can be excreted in the urine in high amounts. This is the most common type of kidney stone.  Calcium Phosphate Stones. These stones may occur when the pH of the urine becomes too high, or less acidic, from too much calcium being excreted in the urine. The pH is a measure of how acidic or basic a substance is.  Uric Acid Stones. This type of stone occurs when the pH of the urine becomes too low, or very acidic, because substances called purines build up in the urine. Purines are found in animal proteins. When the urine is highly concentrated with acid, uric acid kidney stones can form.  Other risk factors for kidney stones include genetics, environment, and being overweight. Your caregiver may ask you to follow specific diet guidelines based on the type of stone you have to lessen the chances of your body making more kidney stones.  GENERAL GUIDELINES FOR ALL TYPES OF STONES  Drink plenty of fluid. Drink 12 16 cups of fluid a day, drinking mainly water.This is the most important thing you can do to prevent the formation of future kidney stones.  Maintain a healthy weight. Your caregiver or dietitian can help you determine what a healthy weight is for you. If you are overweight, weight loss may help prevent the formation of future kidney stones.  Eat a diet adequate in animal protein. Too much animal protein can contribute to the formation of stones. Your  dietitian can help you determine how much protein you should be eating. Avoid low carbohydrate, high protein diets.  Follow a balanced eating approach. The DASH diet, which stands for "Dietary Approaches to Stop Hypertension," is an effective meal plan for reducing stone formation. This diet is high in fruits, vegetables, dairy, and whole grains and low in animal protein. Ask your caregiver or dietitian for information about the DASH diet. ADDITIONAL DIET GUIDELINES FOR CALCIUM STONES Avoid foods high in salt. This includes table salt, salt seasonings, MSG, soy sauce, cured and processed meats, salted crackers and snack foods, fast food, and canned soups and foods. Ask your caregiver or dietitian for information about reducing sodium in your diet or following the low sodium diet.  Ensure adequate calcium intake. Use the following table for calcium guidelines:  Men 42 years old and younger  1000 mg/day.  Men 54 years old and older  1500 mg/day.  Women 93 69 years old  1000 mg/day.  Women 50 years and older  1500 mg/day. Your dietitian can help you determine if you are getting enough calcium in your diet. Foods that are high in calcium include dairy products, broccoli, cheese, yogurt, and pudding. If you need to take a calcium supplement, take it only in the form of calcium citrate.  Avoid foods high in oxalate. Be sure that any supplements you take do not contain more than 500 mg of vitamin C. Vitamin C is converted into oxalate in the body. You do  not need to avoid fruits and vegetables high in vitamin C.  °· Grains: High-fiber or bran cereal, whole-wheat bread, grits, barley, buckwheat, amaranth, pretzels, and fruitcake. °· Vegetables: Dried beans, wax beans, dark leafy greens, eggplant, leeks, okra, parsley, rutabaga, tomato paste, watercress, zucchini, and escarole. °· Fruit: Dried apricots, red currants, figs, kiwi, and rhubarb. °· Meat and Meat Substitutes: Soybeans and foods made from soy  (soyburger, miso), dried beans, peanut butter. °· Milk: Chocolate milk mixes and soymilk. °· Fats and Oils: Nuts (peanuts, almonds, pecans, cashews, hazelnuts) and nut butters, sesame seeds, and tDahini paste. °· Condiments/Miscellaneous: Chocolate, carob, marmalade, poppy seeds, instant iced tea, and juice from high-oxalate fruits.    °Document Released: 05/23/2010 Document Revised: 07/28/2011 Document Reviewed: 07/13/2011 °ExitCare® Patient Information ©2014 [employer]. ° °Kidney Stones °Kidney stones (urolithiasis) are solid masses that form inside your kidneys. The intense pain is caused by the stone moving through the kidney, ureter, bladder, and urethra (urinary tract). When the stone moves, the ureter starts to spasm around the stone. The stone is usually passed in your pee (urine).  °HOME CARE °· Drink enough fluids to keep your pee clear or pale yellow. This helps to get the stone out. °· Strain all pee through the provided strainer. Do not pee without peeing through the strainer, not even once. If you pee the stone out, catch it in the strainer. The stone may be as small as a grain of salt. Take this to your doctor. This will help your doctor figure out what you can do to try to prevent more kidney stones. °· Only take medicine as told by your doctor. °· Follow up with your doctor as told. °· Get follow-up X-rays as told by your doctor. °GET HELP IF: °You have pain that gets worse even if you have been taking pain medicine. °GET HELP RIGHT AWAY IF:  °· Your pain does not get better with medicine. °· You have a fever or shaking chills. °· Your pain increases and gets worse over 18 hours. °· You have new belly (abdominal) pain. °· You feel faint or pass out. °· You are unable to pee. °MAKE SURE YOU:  °· Understand these instructions. °· Will watch your condition. °· Will get help right away if you are not doing well or get worse. °Document Released: 07/15/2007 Document Revised: 09/28/2012 Document  Reviewed: 06/29/2012 °ExitCare® Patient Information ©2014 [employer]. ° °

## 2013-05-01 NOTE — Op Note (Signed)
NAMELAREN, WHALING NO.:  [id number]  MEDICAL RECORD NO.:  [id number]  LOCATION:  8144                         FACILITY:  [facility]  PHYSICIAN:  Alexis Frock, MD     DATE OF BIRTH:  09-03-1945  DATE OF PROCEDURE:  04/30/2013 DATE OF DISCHARGE:                              OPERATIVE REPORT   PREOPERATIVE DIAGNOSES:  Right ureteral stone, fevers worrisome for infected pyelonephritis.  POSTOPERATIVE DIAGNOSES:  Right ureteral stone, fevers worrisome for infected pyelonephritis plus small sessile posterior bladder mass less than 1 cm in diameter.  ESTIMATED BLOOD LOSS:  Nil.  COMPLICATIONS:  None.  SPECIMENS: 1. Cold cup biopsy of small posterior sessile bladder lesion. 2. Urine for Gram stain and culture post ureteral stenting.  FINDINGS: 1. Filling defect in the right proximal ureter consistent with known     stone. 2. Vigorous efflux of purulent-appearing proteinaceous urine post     stent placement. 3. Small less than 1 cm sessile posterior inter-trigonal bladder     lesion.  This was biopsied and fulgurated. 4. Stent, 6 x 26 right Polaris type.  INDICATION:  Shawn Graham is a pleasant 68 year old gentleman with history of 1 prior episode of spontaneously passed nephrolithiasis who presented to the [place] ER earlier today with a known right ureteral stone and new fevers up to 101.5 at home, also with significant malaise and diaphoresis and this clinical picture was worrisome for possible infected obstructing stone.  Options were discussed including ureteral stent placement with the goal of being urgent renal decompression to allow for clearance of infection before definitive stone manipulation. He wished to proceed.  Informed consent was obtained and placed in medical record.  PROCEDURE IN DETAIL:  The patient being Shawn Graham verified.  Procedure being cysto and right ureteral stent placed was confirmed.  Procedure was carried out.  Time-out  was performed.  Intravenous antibiotics were administered.  General LMA anesthesia was introduced.  The patient was placed into a low lithotomy position.  Sterile field was created by prepping and draping the patient's penis, perineum, and proximal thighs using iodine x3.  Next, cystourethroscopy was performed using a 22- French rigid scope with 12-degree offset lens.  Inspection of the anterior and posterior urethra unremarkable.  Inspection of the urinary bladder revealed no diverticula, calcifications, or papillary lesions. There was a small sessile relatively erythematous lesion in the posterior intertrigonal area.  This did not appear to be obvious reactive changes or cystitis cystica and given the unusual appearance it is felt that the biopsy was warranted.  However, attention at this point was directed at right ureteral stent placement.  The right ureteral orifice was cannulated with a 5-French Foley catheter and very gentle right retrograde pyelogram was seen.  Right retrograde pyelogram demonstrated a single right ureter, single system, right kidney.  There was a filling defect in the proximal ureter consistent with known stone with very mild hydronephrosis above this.  A 0.038 Sensor wire was advanced to the level of the upper pole, of which a new 6 x 26 Polaris type stent was carefully placed using fluoroscopic and cystoscopic guidance, proximal and distal points were noted.  Upon  placement, there was immediate efflux of proteinaceous and somewhat purulent-appearing debris to be the distal end of the stent.  A urine sample was obtained for this Gram stain and culture and set aside. Given the somewhat worrisome posterior bladder mass and the patient's hemodynamic instability, it is felt that proceeding with a cold cup biopsy was prudent as cold cup biopsy forceps were used to grasp the small lesion in question and this was allowing biopsy x2.  This set aside for permanent  pathology.  The base of this was fulgurated using Bugbee electrode.  Inspection following these maneuvers revealed complete hemostasis.  No evidence of bladder injury or perforation. Bladder was emptied per cystoscope.  Procedure was then terminated.  The patient tolerated the procedure well.  There were no immediate periprocedural complications.  The patient was taken to postanesthesia care unit in a stable condition.          ______________________________ Alexis Frock, MD     TM/MEDQ  D:  04/30/2013  T:  05/01/2013  Job:  [id number]

## 2013-05-01 NOTE — Progress Notes (Signed)
Urology Progress Note  1 Day Post-Op : AF. Feels much better this AM.   Subjective:     No acute urologic events overnight. Ambulation:   positive Flatus:    positive Bowel movement  positive  Pain: complete resolution  Objective:  Blood pressure 116/73, pulse 64, temperature 97.4 F (36.3 C), temperature source Oral, resp. rate 16, height 6\' 4"  (1.93 m), weight 109.3 kg (240 lb 15.4 oz), SpO2 100.00%.  Physical Exam:  General:  No acute distress, awake Extremities: extremities normal, atraumatic, no cyanosis or edema Genitourinary:  wnl Foley: out    I/O last 3 completed shifts: In: 726.3 [I.V.:726.3] Out: 2050 [Urine:2050]  Recent Labs     04/30/13  1730  HGB  12.8*  WBC  9.8  PLT  185    Recent Labs     04/30/13  1730  NA  140  K  4.0  CL  102  CO2  24  BUN  17  CREATININE  1.79*  CALCIUM  9.1  GFRNONAA  37*  GFRAA  43*     No results found for this basename: PT, INR, APTT,  in the last 72 hours   No components found with this basename: ABG,   Assessment/Plan:  KUB evaluated: Following Rule of 9's:  1. > 9cm from flank ( 12cm +_)                                                                                               2. >900 HU ( 2000)                                                                                               3. < 58mm in size ( 35mm_ Stone is in the upper ureter, and now has stent in place. Anticipate that it will move as ureter dilates. He is not  an ideal  Candidate for lithotripsy because of stone density and width of abdomen. He will b a better candidate for ureteroscopy and laser litotripsy.    He is AF, and had normal wbc. He should be able to go home today on cipro, and be scheduled for OP ureteroscopy and laser of his R ureteral stone.   Pt is to increase activities as tolerated. Shower, feed, schedule op ureteroscopy/laser of stone.

## 2013-05-01 NOTE — Discharge Summary (Signed)
Physician Discharge Summary  Patient ID: Shawn Graham MRN: [id number] DOB/AGE: January 25, 1946 68 y.o.  With R renal colic from 6.1WE Right upper ureteral impacted stone, and temp 101.5. Normal wbc. UTI by U/a.. Now post Right JJ stent. KUB shows 2000 HU and stone iw 12 cm from flank.   Admit date: 04/30/2013 Discharge date: 05/01/2013  Admission Diagnoses: Ureterolithiasis [592.1] Fever [780.60]  Discharge Diagnoses:  Active Problems:   Ureterolithiasis   Ureteral stone   Discharged Condition:   improved  Hospital Course:   IV fluids and OR for JJ stent  Consults: None  Significant Diagnostic Studies: Dg Chest 2 View  04/30/2013   CLINICAL DATA:  Right lower quadrant and pelvic pain, fever, recent surgery for kidney stone, history hiatal hernia  EXAM: CHEST  2 VIEW  COMPARISON:  None  FINDINGS: Borderline enlargement of cardiac silhouette.  Moderate-sized hiatal hernia.  Numerous surgical clips adjacent to hiatal hernia and proximal stomach.  Mediastinal contours and pulmonary vascularity otherwise normal.  Peribronchial thickening without infiltrate, pleural effusion or pneumothorax.  Bones demineralized.  Mild superior endplate compression deformity of a mid to lower thoracic vertebra, appears old.  IMPRESSION: Bronchitic changes.  Borderline enlargement of cardiac silhouette.  Moderate-sized hiatal hernia.   Electronically Signed   By: Shawn Graham M.D.   On: 04/30/2013 18:21   Dg Abd 1 View  04/30/2013   CLINICAL DATA:  Right lower quadrant and pelvic pain, fever, recent surgery for kidney stone, history hiatal hernia  EXAM: ABDOMEN - 1 VIEW  COMPARISON:  None  FINDINGS: Questionable 8 mm right paraspinal calcification at L4 versus bowel artifact.  Bowel gas pattern normal.  Two surgical clips are noted at the lateral right mid abdomen.  Bones appear diffusely demineralized.  IMPRESSION: Normal bowel gas pattern.  Questionable 8 mm right paraspinal calcification at L4, cannot exclude a  proximal right ureteral calculus ; recommend correlation with patient history.  If further imaging required, recommend CT.   Electronically Signed   By: Shawn Graham M.D.   On: 04/30/2013 18:23    Treatments: surgery: ( JJ stent)  Discharge Exam: Blood pressure 116/73, pulse 64, temperature 97.4 F (36.3 C), temperature source Oral, resp. rate 16, height 6\' 4"  (1.93 m), weight 109.3 kg (240 lb 15.4 oz), SpO2 100.00%. General appearance: alert and cooperative  Disposition:   Discharge Orders   Future Orders Complete By Expires   Call MD for:  persistant nausea and vomiting  As directed    Call MD for:  severe uncontrolled pain  As directed    Call MD for:  temperature >100.4  As directed    Diet - low sodium heart healthy  As directed    Discharge instructions  As directed    Comments:     Call Shawn Graham at [phone] to set up surgery for stone removal.   Discharge patient  As directed    Discontinue IV  As directed    Increase activity slowly  As directed        Medication List         cetirizine 10 MG tablet  Commonly known as:  ZYRTEC  Take 10 mg by mouth daily.     ciprofloxacin 500 MG tablet  Commonly known as:  CIPRO  Take 1 tablet (500 mg total) by mouth 2 (two) times daily.     ketorolac 10 MG tablet  Commonly known as:  TORADOL  Take 10 mg by mouth every 8 (eight) hours as needed  for moderate pain or severe pain.     multivitamin with minerals Tabs tablet  Take 1 tablet by mouth daily.     oxyCODONE 5 MG immediate release tablet  Commonly known as:  Oxy IR/ROXICODONE  Take 5-10 mg by mouth every 6 (six) hours as needed for moderate pain or severe pain.     oxyCODONE-acetaminophen 5-325 MG per tablet  Commonly known as:  ROXICET  Take 1 tablet by mouth every 6 (six) hours as needed for severe pain.     phenazopyridine 200 MG tablet  Commonly known as:  PYRIDIUM  Take 1 tablet (200 mg total) by mouth 3 (three) times daily as needed for pain.     promethazine 25  MG tablet  Commonly known as:  PHENERGAN  Take 25 mg by mouth every 4 (four) hours as needed for nausea or vomiting.     tamsulosin 0.4 MG Caps capsule  Commonly known as:  FLOMAX  Take 0.4 mg by mouth daily.           Follow-up Information   Follow up with Shawn Graham I, MD. (call Shawn Graham at [phone] to schedule surgery)    Specialty:  Urology   Contact information:   [facility]  [place] [address] [phone]     will call for scheduling.   SignedCarolan Graham I 05/01/2013, 7:21 AM

## 2013-05-02 ENCOUNTER — Encounter (HOSPITAL_BASED_OUTPATIENT_CLINIC_OR_DEPARTMENT_OTHER): Payer: Self-pay | Admitting: *Deleted

## 2013-05-02 NOTE — Progress Notes (Signed)
NPO AFTER MN. ARRIVE AT 0915. CURRENT LAB RESULTS IN EPIC AND CHART. MAY TAKE ONE TYPE PAIN RX AND/ OR NAUSEA MED AM DOS W/ SIPS OF WATER.

## 2013-05-03 LAB — BODY FLUID CULTURE

## 2013-05-04 ENCOUNTER — Encounter (HOSPITAL_BASED_OUTPATIENT_CLINIC_OR_DEPARTMENT_OTHER): Payer: Self-pay

## 2013-05-04 ENCOUNTER — Encounter (HOSPITAL_BASED_OUTPATIENT_CLINIC_OR_DEPARTMENT_OTHER)
Admission: RE | Disposition: A | Discharge status: Home / Self Care | Payer: Self-pay | Source: Ambulatory Visit | Attending: Urology

## 2013-05-04 ENCOUNTER — Ambulatory Visit (HOSPITAL_BASED_OUTPATIENT_CLINIC_OR_DEPARTMENT_OTHER)
Admission: RE | Admit: 2013-05-04 | Discharge: 2013-05-04 | Disposition: A | Discharge status: Home / Self Care | Payer: Medicare Other | Source: Ambulatory Visit | Attending: Urology | Admitting: Urology

## 2013-05-04 ENCOUNTER — Encounter (HOSPITAL_BASED_OUTPATIENT_CLINIC_OR_DEPARTMENT_OTHER): Payer: Medicare Other | Admitting: Anesthesiology

## 2013-05-04 ENCOUNTER — Ambulatory Visit (HOSPITAL_BASED_OUTPATIENT_CLINIC_OR_DEPARTMENT_OTHER): Payer: Medicare Other | Admitting: Anesthesiology

## 2013-05-04 DIAGNOSIS — Z87891 Personal history of nicotine dependence: Secondary | ICD-10-CM | POA: Insufficient documentation

## 2013-05-04 DIAGNOSIS — K219 Gastro-esophageal reflux disease without esophagitis: Secondary | ICD-10-CM | POA: Insufficient documentation

## 2013-05-04 DIAGNOSIS — N1330 Unspecified hydronephrosis: Secondary | ICD-10-CM | POA: Insufficient documentation

## 2013-05-04 DIAGNOSIS — M129 Arthropathy, unspecified: Secondary | ICD-10-CM | POA: Insufficient documentation

## 2013-05-04 DIAGNOSIS — N201 Calculus of ureter: Secondary | ICD-10-CM | POA: Insufficient documentation

## 2013-05-04 DIAGNOSIS — Z79899 Other long term (current) drug therapy: Secondary | ICD-10-CM | POA: Insufficient documentation

## 2013-05-04 DIAGNOSIS — E7800 Pure hypercholesterolemia, unspecified: Secondary | ICD-10-CM | POA: Insufficient documentation

## 2013-05-04 DIAGNOSIS — Z85828 Personal history of other malignant neoplasm of skin: Secondary | ICD-10-CM | POA: Insufficient documentation

## 2013-05-04 HISTORY — DX: Unspecified osteoarthritis, unspecified site: M19.90

## 2013-05-04 HISTORY — DX: Personal history of other diseases of the digestive system: Z87.19

## 2013-05-04 HISTORY — DX: Presence of spectacles and contact lenses: Z97.3

## 2013-05-04 HISTORY — DX: Hyperlipidemia, unspecified: E78.5

## 2013-05-04 HISTORY — PX: HOLMIUM LASER APPLICATION: SHX5852

## 2013-05-04 HISTORY — PX: CYSTOSCOPY WITH RETROGRADE PYELOGRAM, URETEROSCOPY AND STENT PLACEMENT: SHX5789

## 2013-05-04 HISTORY — DX: Personal history of urinary calculi: Z87.442

## 2013-05-04 HISTORY — DX: Diverticulosis of large intestine without perforation or abscess without bleeding: K57.30

## 2013-05-04 HISTORY — DX: Calculus of ureter: N20.1

## 2013-05-04 SURGERY — CYSTOURETEROSCOPY, WITH RETROGRADE PYELOGRAM AND STENT INSERTION
Anesthesia: General | Site: Ureter | Laterality: Right

## 2013-05-04 MED ORDER — PROPOFOL 10 MG/ML IV BOLUS
INTRAVENOUS | Status: DC | PRN
  Administered 2013-05-04: 250 mg via INTRAVENOUS

## 2013-05-04 MED ORDER — KETOROLAC TROMETHAMINE 30 MG/ML IJ SOLN
INTRAMUSCULAR | Status: DC | PRN
  Administered 2013-05-04: 30 mg via INTRAVENOUS

## 2013-05-04 MED ORDER — CEFAZOLIN SODIUM-DEXTROSE 2-3 GM-% IV SOLR
INTRAVENOUS | Status: DC | PRN
  Administered 2013-05-04: 2 g via INTRAVENOUS

## 2013-05-04 MED ORDER — BELLADONNA ALKALOIDS-OPIUM 16.2-60 MG RE SUPP
RECTAL | Status: DC | PRN
  Administered 2013-05-04: 1 via RECTAL

## 2013-05-04 MED ORDER — CIPROFLOXACIN HCL 500 MG PO TABS: 500.0000 mg | ORAL_TABLET | Freq: Two times a day (BID) | ORAL | Status: DC

## 2013-05-04 MED ORDER — LACTATED RINGERS IV SOLN
INTRAVENOUS | Status: DC | PRN
  Administered 2013-05-04 (×2): via INTRAVENOUS

## 2013-05-04 MED ORDER — MIDAZOLAM HCL 2 MG/2ML IJ SOLN
INTRAMUSCULAR | Status: AC
  Filled 2013-05-04: qty 2

## 2013-05-04 MED ORDER — SODIUM CHLORIDE 0.9 % IR SOLN
Status: DC | PRN
  Administered 2013-05-04: 4000 mL

## 2013-05-04 MED ORDER — MIDAZOLAM HCL 5 MG/5ML IJ SOLN
INTRAMUSCULAR | Status: DC | PRN
  Administered 2013-05-04: 2 mg via INTRAVENOUS

## 2013-05-04 MED ORDER — LIDOCAINE HCL (CARDIAC) 20 MG/ML IV SOLN
INTRAVENOUS | Status: DC | PRN
  Administered 2013-05-04: 100 mg via INTRAVENOUS

## 2013-05-04 MED ORDER — PROMETHAZINE HCL 25 MG/ML IJ SOLN
6.2500 mg | INTRAMUSCULAR | Status: DC | PRN
  Filled 2013-05-04: qty 1

## 2013-05-04 MED ORDER — CEFAZOLIN SODIUM-DEXTROSE 2-3 GM-% IV SOLR
2.0000 g | INTRAVENOUS | Status: DC
Start: 2013-05-04 — End: 2013-05-04
  Filled 2013-05-04: qty 50

## 2013-05-04 MED ORDER — LIDOCAINE HCL 2 % EX GEL
CUTANEOUS | Status: DC | PRN
  Administered 2013-05-04: 1 via URETHRAL

## 2013-05-04 MED ORDER — PHENAZOPYRIDINE HCL 200 MG PO TABS: 200.0000 mg | ORAL_TABLET | Freq: Three times a day (TID) | ORAL | Status: DC | PRN

## 2013-05-04 MED ORDER — ACETAMINOPHEN 10 MG/ML IV SOLN
INTRAVENOUS | Status: DC | PRN
  Administered 2013-05-04: 1000 mg via INTRAVENOUS

## 2013-05-04 MED ORDER — FENTANYL CITRATE 0.05 MG/ML IJ SOLN
INTRAMUSCULAR | Status: AC
  Filled 2013-05-04: qty 6

## 2013-05-04 MED ORDER — FENTANYL CITRATE 0.05 MG/ML IJ SOLN
25.0000 ug | INTRAMUSCULAR | Status: DC | PRN
  Filled 2013-05-04: qty 1

## 2013-05-04 MED ORDER — FENTANYL CITRATE 0.05 MG/ML IJ SOLN
INTRAMUSCULAR | Status: DC | PRN
  Administered 2013-05-04 (×6): 50 ug via INTRAVENOUS

## 2013-05-04 MED ORDER — ONDANSETRON HCL 4 MG/2ML IJ SOLN
INTRAMUSCULAR | Status: DC | PRN
  Administered 2013-05-04: 4 mg via INTRAVENOUS

## 2013-05-04 MED ORDER — DEXAMETHASONE SODIUM PHOSPHATE 4 MG/ML IJ SOLN
INTRAMUSCULAR | Status: DC | PRN
  Administered 2013-05-04: 10 mg via INTRAVENOUS

## 2013-05-04 MED ORDER — OXYCODONE-ACETAMINOPHEN 5-325 MG PO TABS: 1.0000 | ORAL_TABLET | ORAL | Status: DC | PRN

## 2013-05-04 MED ORDER — IOHEXOL 350 MG/ML SOLN
INTRAVENOUS | Status: DC | PRN
  Administered 2013-05-04: 20 mL

## 2013-05-04 MED ORDER — BELLADONNA ALKALOIDS-OPIUM 16.2-60 MG RE SUPP
RECTAL | Status: AC
  Filled 2013-05-04: qty 1

## 2013-05-04 MED ORDER — KETOROLAC TROMETHAMINE 30 MG/ML IJ SOLN
15.0000 mg | Freq: Once | INTRAMUSCULAR | Status: DC | PRN
  Filled 2013-05-04: qty 1

## 2013-05-04 MED ORDER — LACTATED RINGERS IV SOLN
INTRAVENOUS | Status: DC
  Administered 2013-05-04 (×2): via INTRAVENOUS
  Filled 2013-05-04: qty 1000

## 2013-05-04 SURGICAL SUPPLY — 45 items
ADAPTER CATH URET PLST 4-6FR (CATHETERS) IMPLANT
ADPR CATH URET STRL DISP 4-6FR (CATHETERS)
BAG DRAIN URO-CYSTO SKYTR STRL (DRAIN) ×3 IMPLANT
BAG DRN UROCATH (DRAIN) ×1
BASKET LASER NITINOL 1.9FR (BASKET) IMPLANT
BASKET STNLS GEMINI 4WIRE 3FR (BASKET) IMPLANT
BASKET ZERO TIP NITINOL 2.4FR (BASKET) ×2 IMPLANT
BOOTIES KNEE HIGH SLOAN (MISCELLANEOUS) ×3 IMPLANT
BSKT STON RTRVL 120 1.9FR (BASKET)
BSKT STON RTRVL GEM 120X11 3FR (BASKET)
BSKT STON RTRVL ZERO TP 2.4FR (BASKET) ×1
CANISTER SUCT LVC 12 LTR MEDI- (MISCELLANEOUS) ×2 IMPLANT
CATH CLEAR GEL 3F BACKSTOP (CATHETERS) ×2 IMPLANT
CATH INTERMIT  6FR 70CM (CATHETERS) IMPLANT
CATH URET 5FR 28IN CONE TIP (BALLOONS)
CATH URET 5FR 28IN OPEN ENDED (CATHETERS) ×2 IMPLANT
CATH URET 5FR 70CM CONE TIP (BALLOONS) IMPLANT
CATH URET DUAL LUMEN 6-10FR 50 (CATHETERS) IMPLANT
CLOTH BEACON ORANGE TIMEOUT ST (SAFETY) ×3 IMPLANT
DRAPE CAMERA CLOSED 9X96 (DRAPES) ×3 IMPLANT
ELECT REM PT RETURN 9FT ADLT (ELECTROSURGICAL)
ELECTRODE REM PT RTRN 9FT ADLT (ELECTROSURGICAL) IMPLANT
EXTRACTOR STONE NITINOL NGAGE (UROLOGICAL SUPPLIES) ×2 IMPLANT
FIBER LASER FLEXIVA 200 (UROLOGICAL SUPPLIES) ×2 IMPLANT
GLOVE BIO SURGEON STRL SZ7 (GLOVE) ×3 IMPLANT
GLOVE BIOGEL M STER SZ 6 (GLOVE) ×2 IMPLANT
GLOVE BIOGEL PI IND STRL 6.5 (GLOVE) IMPLANT
GLOVE BIOGEL PI INDICATOR 6.5 (GLOVE) ×4
GOWN PREVENTION PLUS LG XLONG (DISPOSABLE) ×1 IMPLANT
GOWN STRL REIN XL XLG (GOWN DISPOSABLE) ×1 IMPLANT
GOWN STRL REUS W/TWL LRG LVL3 (GOWN DISPOSABLE) ×2 IMPLANT
GOWN STRL REUS W/TWL XL LVL3 (GOWN DISPOSABLE) ×2 IMPLANT
GUIDEWIRE 0.038 PTFE COATED (WIRE) IMPLANT
GUIDEWIRE ANG ZIPWIRE 038X150 (WIRE) IMPLANT
GUIDEWIRE STR DUAL SENSOR (WIRE) IMPLANT
IV NS 1000ML (IV SOLUTION) ×12
IV NS 1000ML BAXH (IV SOLUTION) IMPLANT
IV NS IRRIG 3000ML ARTHROMATIC (IV SOLUTION) ×2 IMPLANT
KIT BALLIN UROMAX 15FX10 (LABEL) IMPLANT
KIT BALLN UROMAX 15FX4 (MISCELLANEOUS) IMPLANT
KIT BALLN UROMAX 26 75X4 (MISCELLANEOUS)
SET HIGH PRES BAL DIL (LABEL)
SHEATH ACCESS URETERAL 38CM (SHEATH) IMPLANT
SHEATH ACCESS URETERAL 54CM (SHEATH) IMPLANT
STENT POLARIS 5FRX26 (STENTS) ×2 IMPLANT

## 2013-05-04 NOTE — Anesthesia Procedure Notes (Signed)
Procedure Name: LMA Insertion Date/Time: 05/04/2013 12:22 PM Performed by: Justice Rocher Pre-anesthesia Checklist: Patient identified, Emergency Drugs available, Suction available and Patient being monitored Patient Re-evaluated:Patient Re-evaluated prior to inductionOxygen Delivery Method: Circle System Utilized Preoxygenation: Pre-oxygenation with 100% oxygen Intubation Type: IV induction Ventilation: Mask ventilation without difficulty LMA: LMA inserted LMA Size: 5.0 Number of attempts: 1 Airway Equipment and Method: bite block Placement Confirmation: positive ETCO2 Tube secured with: Tape Dental Injury: Teeth and Oropharynx as per pre-operative assessment

## 2013-05-04 NOTE — Transfer of Care (Signed)
Immediate Anesthesia Transfer of Care Note  Patient: Shawn Graham  Procedure(s) Performed: Procedure(s) (LRB): CYSTOSCOPY WITH RIGHT RETROGRADE PYELOGRAM, URETEROSCOPY AND LASER OF STONE AND Placement  DOUBLE J STENT (Right) HOLMIUM LASER APPLICATION (Right)  Patient Location: PACU  Anesthesia Type: General  Level of Consciousness: awake, sedated, patient cooperative and responds to stimulation  Airway & Oxygen Therapy: Patient Spontanous Breathing and Patient connected to face mask oxygen  Post-op Assessment: Report given to PACU RN, Post -op Vital signs reviewed and stable and Patient moving all extremities  Post vital signs: Reviewed and stable  Complications: No apparent anesthesia complications

## 2013-05-04 NOTE — Discharge Instructions (Addendum)
Kidney Stones Kidney stones (urolithiasis) are solid masses that form inside your kidneys. The intense pain is caused by the stone moving through the kidney, ureter, bladder, and urethra (urinary tract). When the stone moves, the ureter starts to spasm around the stone. The stone is usually passed in your pee (urine).  HOME CARE  Drink enough fluids to keep your pee clear or pale yellow. This helps to get the stone out.  Strain all pee through the provided strainer. Do not pee without peeing through the strainer, not even once. If you pee the stone out, catch it in the strainer. The stone may be as small as a grain of salt. Take this to your doctor. This will help your doctor figure out what you can do to try to prevent more kidney stones.  Only take medicine as told by your doctor.  Follow up with your doctor as told.  Get follow-up X-rays as told by your doctor. GET HELP IF: You have pain that gets worse even if you have been taking pain medicine. GET HELP RIGHT AWAY IF:   Your pain does not get better with medicine.  You have a fever or shaking chills.  Your pain increases and gets worse over 18 hours.  You have new belly (abdominal) pain.  You feel faint or pass out.  You are unable to pee. MAKE SURE YOU:   Understand these instructions.  Will watch your condition.  Will get help right away if you are not doing well or get worse. Document Released: 07/15/2007 Document Revised: 09/28/2012 Document Reviewed: 06/29/2012 [facility] Patient Information 2014 [place], [place].  Diet for Kidney Stones Kidney stones are small, hard masses that form inside your kidneys. They are made up of salts and minerals and often form when high levels build up in the urine. The minerals can then start to build up, crystalize, and stick together to form stones. There are several different types of kidney stones. The following types of stones may be influenced by dietary factors:   Calcium Oxalate  Stones. An oxalate is a salt found in certain foods. Within the body, calcium can combine with oxalates to form calcium oxalate stones, which can be excreted in the urine in high amounts. This is the most common type of kidney stone.  Calcium Phosphate Stones. These stones may occur when the pH of the urine becomes too high, or less acidic, from too much calcium being excreted in the urine. The pH is a measure of how acidic or basic a substance is.  Uric Acid Stones. This type of stone occurs when the pH of the urine becomes too low, or very acidic, because substances called purines build up in the urine. Purines are found in animal proteins. When the urine is highly concentrated with acid, uric acid kidney stones can form.  Other risk factors for kidney stones include genetics, environment, and being overweight. Your caregiver may ask you to follow specific diet guidelines based on the type of stone you have to lessen the chances of your body making more kidney stones.  GENERAL GUIDELINES FOR ALL TYPES OF STONES  Drink plenty of fluid. Drink 12 16 cups of fluid a day, drinking mainly water.This is the most important thing you can do to prevent the formation of future kidney stones.  Maintain a healthy weight. Your caregiver or dietitian can help you determine what a healthy weight is for you. If you are overweight, weight loss may help prevent the formation of future  kidney stones.  Eat a diet adequate in animal protein. Too much animal protein can contribute to the formation of stones. Your dietitian can help you determine how much protein you should be eating. Avoid low carbohydrate, high protein diets.  Follow a balanced eating approach. The DASH diet, which stands for "Dietary Approaches to Stop Hypertension," is an effective meal plan for reducing stone formation. This diet is high in fruits, vegetables, dairy, and whole grains and low in animal protein. Ask your caregiver or dietitian for  information about the DASH diet. ADDITIONAL DIET GUIDELINES FOR CALCIUM STONES Avoid foods high in salt. This includes table salt, salt seasonings, MSG, soy sauce, cured and processed meats, salted crackers and snack foods, fast food, and canned soups and foods. Ask your caregiver or dietitian for information about reducing sodium in your diet or following the low sodium diet.  Ensure adequate calcium intake. Use the following table for calcium guidelines:  Men 17 years old and younger  1000 mg/day.  Men 15 years old and older  1500 mg/day.  Women 37 70 years old  1000 mg/day.  Women 50 years and older  1500 mg/day. Your dietitian can help you determine if you are getting enough calcium in your diet. Foods that are high in calcium include dairy products, broccoli, cheese, yogurt, and pudding. If you need to take a calcium supplement, take it only in the form of calcium citrate.  Avoid foods high in oxalate. Be sure that any supplements you take do not contain more than 500 mg of vitamin C. Vitamin C is converted into oxalate in the body. You do not need to avoid fruits and vegetables high in vitamin C.   Grains: High-fiber or bran cereal, whole-wheat bread, grits, barley, buckwheat, amaranth, pretzels, and fruitcake.  Vegetables: Dried beans, wax beans, dark leafy greens, eggplant, leeks, okra, parsley, rutabaga, tomato paste, watercress, zucchini, and escarole.  Fruit: Dried apricots, red currants, figs, kiwi, and rhubarb.  Meat and Meat Substitutes: Soybeans and foods made from soy (soyburger, miso), dried beans, peanut butter.  Milk: Chocolate milk mixes and soymilk.  Fats and Oils: Nuts (peanuts, almonds, pecans, cashews, hazelnuts) and nut butters, sesame seeds, and tDahini paste.  Condiments/Miscellaneous: Chocolate, carob, marmalade, poppy seeds, instant iced tea, and juice from high-oxalate fruits.  Document Released: 05/23/2010 Document Revised: 07/28/2011 Document Reviewed:  07/13/2011 [facility] Patient Information 2014 [place].  Ureteral Colic (Kidney Stones) Ureteral colic is the result of a condition when kidney stones form inside the kidney. Once kidney stones are formed they may move into the tube that connects the kidney with the bladder (ureter). If this occurs, this condition may cause pain (colic) in the ureter.  CAUSES  Pain is caused by stone movement in the ureter and the obstruction caused by the stone. SYMPTOMS  The pain comes and goes as the ureter contracts around the stone. The pain is usually intense, sharp, and stabbing in character. The location of the pain may move as the stone moves through the ureter. When the stone is near the kidney the pain is usually located in the back and radiates to the belly (abdomen). When the stone is ready to pass into the bladder the pain is often located in the lower abdomen on the side the stone is located. At this location, the symptoms may mimic those of a urinary tract infection with urinary frequency. Once the stone is located here it often passes into the bladder and the pain disappears completely. TREATMENT  Your caregiver will provide you with medicine for pain relief.  You may require specialized follow-up X-rays.  The absence of pain does not always mean that the stone has passed. It may have just stopped moving. If the urine remains completely obstructed, it can cause loss of kidney function or even complete destruction of the involved kidney. It is your responsibility and in your interest that X-rays and follow-ups as suggested by your caregiver are completed. Relief of pain without passage of the stone can be associated with severe damage to the kidney, including loss of kidney function on that side.  If your stone does not pass on its own, additional measures may be taken by your caregiver to ensure its removal. HOME CARE INSTRUCTIONS   Increase your fluid intake. Water is the preferred fluid  since juices containing vitamin C may acidify the urine making it less likely for certain stones (uric acid stones) to pass.  Strain all urine. A strainer will be provided. Keep all particulate matter or stones for your caregiver to inspect.  Take your pain medicine as directed.  Make a follow-up appointment with your caregiver as directed.  Remember that the goal is passage of your stone. The absence of pain does not mean the stone is gone. Follow your caregiver's instructions.  Only take over-the-counter or prescription medicines for pain, discomfort, or fever as directed by your caregiver. SEEK MEDICAL CARE IF:   Pain cannot be controlled with the prescribed medicine.  You have a fever.  Pain continues for longer than your caregiver advises it should.  There is a change in the pain, and you develop chest discomfort or constant abdominal pain.  You feel faint or pass out. MAKE SURE YOU:   Understand these instructions.  Will watch your condition.  Will get help right away if you are not doing well or get worse. Document Released: 11/05/2004 Document Revised: 05/23/2012 Document Reviewed: 07/23/2010 [facility] Patient Information 2014 [place], [place]. [facility] [phone] Post Ureteroscopy With or Without Stent Instructions  Definitions:  Ureter: The duct that transports urine from the kidney to the bladder. Stent:   A plastic hollow tube that is placed into the ureter, from the kidney to the                 bladder to prevent the ureter from swelling shut.  GENERAL INSTRUCTIONS:  Despite the fact that no skin incisions were used, the area around the ureter and bladder is raw and irritated. The stent is a foreign body which will further irritate the bladder wall. This irritation is manifested by increased frequency of urination, both day and night, and by an increase in the urge to urinate. In some, the urge to urinate is present almost always. Sometimes  the urge is strong enough that you may not be able to stop yourself from urinating. The only real cure is to remove the stent and then give time for the bladder wall to heal which can't be done until the danger of the ureter swelling shut has passed, which varies.  You may see some blood in your urine while the stent is in place and a few days afterwards. Do not be alarmed, even if the urine was clear for a while. Get off your feet and drink lots of fluids until clearing occurs. If you start to pass clots or don't improve, call us.  DIET: You may return to your normal diet immediately. Because of the raw surface of your bladder,  alcohol, spicy foods, acid type foods and drinks with caffeine may cause irritation or frequency and should be used in moderation. To keep your urine flowing freely and to avoid constipation, drink plenty of fluids during the day ( 8-10 glasses ). Tip: Avoid cranberry juice because it is very acidic.  ACTIVITY: Your physical activity doesn't need to be restricted. However, if you are very active, you may see some blood in your urine. We suggest that you reduce your activity under these circumstances until the bleeding has stopped.  BOWELS: It is important to keep your bowels regular during the postoperative period. Straining with bowel movements can cause bleeding. A bowel movement every other day is reasonable. Use a mild laxative if needed, such as Milk of Magnesia 2-3 tablespoons, or 2 Dulcolax tablets. Call if you continue to have problems. If you have been taking narcotics for pain, before, during or after your surgery, you may be constipated. Take a laxative if necessary.   MEDICATION: You should resume your pre-surgery medications unless told not to. In addition you will often be given an antibiotic to prevent infection. These should be taken as prescribed until the bottles are finished unless you are having an unusual reaction to one of the drugs.  PROBLEMS YOU  SHOULD REPORT TO [place]:  Fevers over 100.5 Fahrenheit.  Heavy bleeding, or clots ( See above notes about blood in urine ).  Inability to urinate.  Drug reactions ( hives, rash, nausea, vomiting, diarrhea ).  Severe burning or pain with urination that is not improving.  FOLLOW-UP: You will need a follow-up appointment to monitor your progress. Call for this appointment at the number listed above. Usually the first appointment will be about three to fourteen days after your surgery.      Post Anesthesia Home Care Instructions  Activity: Get plenty of rest for the remainder of the day. A responsible adult should stay with you for 24 hours following the procedure.  For the next 24 hours, DO NOT: -Drive a car -Paediatric nurse -Drink alcoholic beverages -Take any medication unless instructed by your physician -Make any legal decisions or sign important papers.  Meals: Start with liquid foods such as gelatin or soup. Progress to regular foods as tolerated. Avoid greasy, spicy, heavy foods. If nausea and/or vomiting occur, drink only clear liquids until the nausea and/or vomiting subsides. Call your physician if vomiting continues.  Special Instructions/Symptoms: Your throat may feel dry or sore from the anesthesia or the breathing tube placed in your throat during surgery. If this causes discomfort, gargle with warm salt water. The discomfort should disappear within 24 hours.

## 2013-05-04 NOTE — Anesthesia Postprocedure Evaluation (Signed)
Anesthesia Post Note  Patient: Shawn Graham  Procedure(s) Performed: Procedure(s) (LRB): CYSTOSCOPY WITH RIGHT RETROGRADE PYELOGRAM, URETEROSCOPY AND LASER OF STONE AND Placement  DOUBLE J STENT (Right) HOLMIUM LASER APPLICATION (Right)  Anesthesia type: General  Patient location: PACU  Post pain: Pain level controlled  Post assessment: Post-op Vital signs reviewed  Last Vitals:  Filed Vitals:   05/04/13 1400  BP: 121/74  Pulse: 74  Temp:   Resp: 12    Post vital signs: Reviewed  Level of consciousness: sedated  Complications: No apparent anesthesia complications

## 2013-05-04 NOTE — Anesthesia Preprocedure Evaluation (Addendum)
Anesthesia Evaluation  Patient identified by MRN, date of birth, ID band Patient awake    Reviewed: Allergy & Precautions, H&P , NPO status , Patient's Chart, lab work & pertinent test results  Airway Mallampati: II TM Distance: >3 FB Neck ROM: Full    Dental no notable dental hx.    Pulmonary former smoker,  breath sounds clear to auscultation  Pulmonary exam normal       Cardiovascular negative cardio ROS  Rhythm:Regular Rate:Normal     Neuro/Psych negative neurological ROS  negative psych ROS   GI/Hepatic negative GI ROS, Neg liver ROS,   Endo/Other  negative endocrine ROS  Renal/GU negative Renal ROS  negative genitourinary   Musculoskeletal negative musculoskeletal ROS (+)   Abdominal   Peds negative pediatric ROS (+)  Hematology negative hematology ROS (+)   Anesthesia Other Findings   Reproductive/Obstetrics negative OB ROS                          Anesthesia Physical Anesthesia Plan  ASA: I  Anesthesia Plan: General   Post-op Pain Management:    Induction: Intravenous  Airway Management Planned: LMA  Additional Equipment:   Intra-op Plan:   Post-operative Plan:   Informed Consent: I have reviewed the patients History and Physical, chart, labs and discussed the procedure including the risks, benefits and alternatives for the proposed anesthesia with the patient or authorized representative who has indicated his/her understanding and acceptance.   Dental advisory given  Plan Discussed with: CRNA and Surgeon  Anesthesia Plan Comments:         Anesthesia Quick Evaluation

## 2013-05-04 NOTE — Op Note (Signed)
Pre-operative diagnosis :   Right upper ureteral stone, continued ureteral colic with nonprogression  Postoperative diagnosis: Same   Operation:  Cystourethroscopy, removal of right double-J stent, right retrograde pyelogram with interpretation, right ureteroscopy, basket manipulation of stone into the lower ureter,nsertion of backstop, laser fragmentation of stone, extraction of stone fragments, insertion of right double-J stent (65 French by 26 cm Polaris).  Surgeon:  Chauncey Cruel. Gaynelle Arabian, MD  First assistant:  None  Anesthesia:  general  Preparation: After appropriate preanesthesia, the patient was brought to the operating room, placed on the upper table in the dorsal supine position where general LMA anesthesia was introduced. The armband was double checked. The history was double checked. He was replaced in dorsal lithotomy position with pubis was prepped with Betadine solution and draped in usual fashion..    Review history:  History of Present Illness  68 yo male presents today for f/u after being seen in [facility] on 04/26/13 with RLQ abdominal pain associated with nausea and vomiting. + Gross hematuria. CT showed a 1mm Rt proximal ureteral stone causing hydronephrosis, and pylosinus backflo and stranding. He has passed a stone 9 yrs ago in [place], [place] -Calcium Oxalate.  No sodas. Pt drinks green tea and coffee. No family history of stones. Minimal fast foods.      Statement of  Likelihood of Success: Excellent. TIME-OUT observed.:  Procedure:  Cystourethroscopy was accomplished showing normal urethra and bilobar BPH. The patient has elevated bladder neck. The trigone was in normal position. A Polaris stent was identified in the right ureter. This was removed without difficulty. Right retrograde PolyGram was performed, and under magnification, a 6 mm stone could be identified in the right upper ureter, just distal to the right ureteropelvic junction. Semirigid ureteroscopy was then  accomplished after safety wire was passed into the renal pelvis, and the stone was identified. The stone was mobile above the level of identification. I elected to pass a stone basket, and bring the stone lower ureter. An in [facility] basket was then used to trap the stone, and manipulate the stone down the ureter, to the level of the telemetry vessels. The stone would not allow itself to be manipulated more distalward.  The scope was removed and the basket was clamped and cut. The ureteroscope was repassed, and the backstop catheter was then passed above the stone, and backstop was placed above the stone. The stone was then manipulated out of the basket, and the basket removed. The 360 laser fiber was in place, and with laser settings of 5/15, the stone was broken. However, the stone was very hard, and laser settings were manipulated to a higher setting, in order to fragment the stone. Once accomplished, pieces the stone were removed and sent for analysis.  Because of multiple ureteroscopy, basket extraction of, and laser fragmented stone, elected to replace double-J stent. Therefore, a size 5 [place] by 26 cm Polaris stent was selected, and passed over the safety wire and coiled in the renal pelvis, and in the ureter. This was accomplished under fluoroscopic control. The patient tolerated the procedure well. He was given IV Tylenol and IV Toradol. He was awakened and taken to recovery room in good condition.

## 2013-05-04 NOTE — Interval H&P Note (Signed)
History and Physical Interval Note:  05/04/2013 10:18 AM  Shawn Graham  has presented today for surgery, with the diagnosis of right upper ureteral stone  The various methods of treatment have been discussed with the patient and family. After consideration of risks, benefits and other options for treatment, the patient has consented to  Procedure(s): CYSTOSCOPY WITH RIGHT RETROGRADE PYELOGRAM, URETEROSCOPY AND LASER OF STONE AND POSSIBLE DOUBLE J STENT (Right) HOLMIUM LASER APPLICATION (Right) as a surgical intervention .  The patient's history has been reviewed, patient examined, no change in status, stable for surgery.  I have reviewed the patient's chart and labs.  Questions were answered to the patient's satisfaction.     [name] I

## 2013-05-04 NOTE — H&P (Signed)
eason For Visit 40mm Rt proximal ureteral stone   Active Problems Problems  1. Right ureteral stone (592.1)  History of Present Illness 68 yo male presents today for f/u after being seen in [place] [facility] on 04/26/13 with RLQ abdominal pain associated with nausea and vomiting. + Gross hematuria. CT showed a 19mm Rt proximal ureteral stone causing hydronephrosis, and pylosinus backflo and stranding. He has passed a stone 9 yrs ago in [place], [place] -Calcium Oxalate.    No sodas. Pt drinks green tea and coffee. No family history of stones. Minimal fast foods.   Past Medical History Problems  1. History of arthritis (V13.4) 2. History of gastroesophageal reflux (GERD) (V12.79) 3. History of hypercholesterolemia (V12.29) 4. History of malignant neoplasm of skin (V10.83)  Surgical History Problems  1. History of Anterior Gastropexy For Hiatal Hernia 2. History of Hand Incision 3. History of Hand Surgery 4. History of Sinus Surgery  Current Meds 1. Multiple Vitamin TABS;  Therapy: (Recorded:20Mar2015) to Recorded 2. OxyCODONE HCl - 5 MG Oral Tablet;  Therapy: (Recorded:20Mar2015) to Recorded 3. Promethazine HCl - 25 MG Oral Tablet;  Therapy: (Recorded:20Mar2015) to Recorded  Allergies Medication  1. No Known Drug Allergies  Family History Problems  1. Family history of acute myocardial infarction (V17.3) : Mother 2. Family history of lung cancer (V16.1) : Father  Social History Problems    Alcohol use (V49.89)   1 per day   Caffeine use (V49.89)   4 per day   Father deceased   Former smoker (V15.82)   1 ppd x 55yrs   Former smoker [place])   Married   Mother deceased   Number of children   2 daughters   Occupation   Insurance account manager  Review of Systems Genitourinary, constitutional, skin, eye, otolaryngeal, hematologic/lymphatic, cardiovascular, pulmonary, endocrine, musculoskeletal, gastrointestinal, neurological and psychiatric system(s) were reviewed and  pertinent findings if present are noted.  Genitourinary: urinary frequency, nocturia, difficulty starting the urinary stream and hematuria, but no feelings of urinary urgency, urine stream is not weak, no incomplete emptying of bladder and initiating urination does not require straining.  Gastrointestinal: nausea, vomiting, flank pain and abdominal pain, but no heartburn, no diarrhea and no constipation.  Constitutional: feeling tired (fatigue), but no fever, no night sweats and no recent weight loss.  Integumentary: no new skin rashes or lesions and no pruritus.  Eyes: no blurred vision and no diplopia.  ENT: no sore throat and no sinus problems.  Hematologic/Lymphatic: no tendency to easily bruise and no swollen glands.  Cardiovascular: no chest pain and no leg swelling.  Respiratory: no shortness of breath and no cough.  Endocrine: no polydipsia.  Musculoskeletal: no back pain and no joint pain.  Neurological: no headache and no dizziness.  Psychiatric: no anxiety and no depression.    Vitals Vital Signs [Data Includes: Last 1 Day]  Recorded: 20Mar2015 03:04PM  Height: 6 ft 4 in Weight: 239 lb  BMI Calculated: 29.09 BSA Calculated: 2.39 Blood Pressure: 132 / 77 Temperature: 100.4 F Heart Rate: 87  Physical Exam Constitutional: Well nourished and well developed . No acute distress.  ENT:. The ears and nose are normal in appearance.  Neck: The appearance of the neck is normal and no neck mass is present.  Pulmonary: No respiratory distress and normal respiratory rhythm and effort.  Cardiovascular: Heart rate and rhythm are normal . No peripheral edema.  Abdomen: The abdomen is soft and nontender. No masses are palpated. No CVA tenderness. No hernias are palpable.  No hepatosplenomegaly noted.  Genitourinary: Examination of the penis demonstrates no discharge, no masses, no lesions and a normal meatus. The scrotum is without lesions. The right epididymis is palpably normal and  non-tender. The left epididymis is palpably normal and non-tender. The right testis is non-tender and without masses. The left testis is non-tender and without masses.  Lymphatics: The femoral and inguinal nodes are not enlarged or tender.  Skin: Normal skin turgor, no visible rash and no visible skin lesions.  Neuro/Psych:. Mood and affect are appropriate.    Results/Data Urine [Data Includes: Last 1 Day]   [id number]  COLOR YELLOW   APPEARANCE CLEAR   SPECIFIC GRAVITY 1.020   pH 5.5   GLUCOSE NEG mg/dL  BILIRUBIN NEG   KETONE TRACE mg/dL  BLOOD MOD   PROTEIN NEG mg/dL  UROBILINOGEN 0.2 mg/dL  NITRITE NEG   LEUKOCYTE ESTERASE NEG   SQUAMOUS EPITHELIAL/HPF NONE SEEN   WBC NONE SEEN WBC/hpf  RBC 3-6 RBC/hpf  BACTERIA NONE SEEN   CRYSTALS NONE SEEN   CASTS NONE SEEN   Selected Results  UA With REFLEX [id number] 02:47PM Gaynelle Arabian, Shmiel Morton  SPECIMEN TYPE: CLEAN CATCH   Test Name Result Flag Reference  COLOR YELLOW  YELLOW  APPEARANCE CLEAR  CLEAR  SPECIFIC GRAVITY 1.020  1.005-1.030  pH 5.5  5.0-8.0  GLUCOSE NEG mg/dL  NEG  BILIRUBIN NEG  NEG  KETONE TRACE mg/dL A NEG  BLOOD MOD A NEG  PROTEIN NEG mg/dL  NEG  UROBILINOGEN 0.2 mg/dL  0.0-1.0  NITRITE NEG  NEG  LEUKOCYTE ESTERASE NEG  NEG  SQUAMOUS EPITHELIAL/HPF NONE SEEN  RARE  WBC NONE SEEN WBC/hpf  <3  RBC 3-6 RBC/hpf A <3  BACTERIA NONE SEEN  RARE  CRYSTALS NONE SEEN  NONE SEEN  CASTS NONE SEEN  NONE SEEN   Assessment Assessed  1. Right ureteral stone (592.1) 2. Gross hematuria (599.71) 3. Renal colic (195.0)  68 yo male with 63mm Right upper ureteral stone, with proximal hydronephrosis, and stranding and pyleosinus backflo. he has 710 HU. 14cm flank to stone length.   Plan Right ureteral stone  1. Start: Sprix 15.75 MG/SPRAY Nasal Solution; INSTILL 1 SQUIRT Every 8 hours 2. Administered: Ketorolac Tromethamine 60 MG/2ML Injection Solution 3. Follow-up Week x 1 Office  Follow-up  Status: Hold For -  Appointment,Date of Service   Requested for: 20Mar2015 4. KUB; Status:Hold For - Exact Date,Appointment,Date of Service; Requested for:With next  appointment;   1. Toradol today/ Sprix  2. RTC for KUB next week, and then surgical scheduling if he has not passed stone.   3. Use strainer.   Signatures Electronically signed by : [name], M.D.; Apr 28 2013  4:26PM EST

## 2013-05-04 NOTE — Interval H&P Note (Signed)
History and Physical Interval Note:  05/04/2013 10:17 AM  Shawn Graham  has presented today for surgery, with the diagnosis of right upper ureteral stone  The various methods of treatment have been discussed with the patient and family. After consideration of risks, benefits and other options for treatment, the patient has consented to  Procedure(s): CYSTOSCOPY WITH RIGHT RETROGRADE PYELOGRAM, URETEROSCOPY AND LASER OF STONE AND POSSIBLE DOUBLE J STENT (Right) HOLMIUM LASER APPLICATION (Right) as a surgical intervention .  The patient's history has been reviewed, patient examined, no change in status, stable for surgery.  I have reviewed the patient's chart and labs.  Questions were answered to the patient's satisfaction.     [name] I

## 2013-05-05 LAB — ANAEROBIC CULTURE

## 2013-05-06 LAB — CULTURE, BLOOD (ROUTINE X 2)
Culture: NO GROWTH
Culture: NO GROWTH

## 2013-05-08 ENCOUNTER — Encounter (HOSPITAL_BASED_OUTPATIENT_CLINIC_OR_DEPARTMENT_OTHER): Payer: Self-pay | Admitting: Urology

## 2014-11-15 ENCOUNTER — Encounter: Payer: Self-pay | Admitting: Gastroenterology

## 2015-01-16 ENCOUNTER — Ambulatory Visit (AMBULATORY_SURGERY_CENTER): Payer: Self-pay | Admitting: *Deleted

## 2015-01-16 VITALS — Ht 75.0 in | Wt 220.0 lb

## 2015-01-16 DIAGNOSIS — Z1211 Encounter for screening for malignant neoplasm of colon: Secondary | ICD-10-CM

## 2015-01-16 MED ORDER — NA SULFATE-K SULFATE-MG SULF 17.5-3.13-1.6 GM/177ML PO SOLN: 1.0000 | Freq: Once | ORAL | Status: DC

## 2015-01-16 NOTE — Progress Notes (Signed)
No egg or soy allergy known to patient  No issues with past sedation with any surgeries  or procedures, no intubation problems  No diet pills No home 02 use per patient   emmi declined   

## 2015-01-17 ENCOUNTER — Encounter: Payer: Self-pay | Admitting: Gastroenterology

## 2015-01-30 ENCOUNTER — Encounter: Payer: Self-pay | Admitting: Gastroenterology

## 2015-01-30 ENCOUNTER — Ambulatory Visit (AMBULATORY_SURGERY_CENTER): Payer: Medicare Other | Admitting: Gastroenterology

## 2015-01-30 VITALS — BP 109/75 | HR 61 | Temp 96.4°F | Resp 16 | Ht 76.0 in | Wt 240.0 lb

## 2015-01-30 DIAGNOSIS — D123 Benign neoplasm of transverse colon: Secondary | ICD-10-CM

## 2015-01-30 DIAGNOSIS — Z1211 Encounter for screening for malignant neoplasm of colon: Secondary | ICD-10-CM | POA: Diagnosis not present

## 2015-01-30 MED ORDER — SODIUM CHLORIDE 0.9 % IV SOLN: 500.0000 mL | INTRAVENOUS | Status: DC

## 2015-01-30 NOTE — Progress Notes (Signed)
Called to room to assist during endoscopic procedure.  Patient ID and intended procedure confirmed with present staff. Received instructions for my participation in the procedure from the performing physician.  

## 2015-01-30 NOTE — Op Note (Signed)
[facility]  [address] [place], [address]   COLONOSCOPY PROCEDURE REPORT  PATIENT: Shawn Graham, Shawn Graham  MR#: [id number] BIRTHDATE: 25-Mar-1945 , 77  yrs. old GENDER: male ENDOSCOPIST: Yetta Flock, MD REFERRED BY: Shanon Ace MD PROCEDURE DATE:  01/30/2015 PROCEDURE:   Colonoscopy, screening, Colonoscopy with snare polypectomy, and Colonoscopy with biopsy First Screening Colonoscopy - Avg.  risk and is 50 yrs.  old or older - No.  Prior Negative Screening - Now for repeat screening. 10 or more years since last screening  History of Adenoma - Now for follow-up colonoscopy & has been > or = to 3 yrs.  N/A  Polyps removed today? Yes ASA CLASS:   Class II INDICATIONS:Screening for colonic neoplasia and Colorectal Neoplasm Risk Assessment for this procedure is average risk. MEDICATIONS: Propofol 200 mg IV  DESCRIPTION OF PROCEDURE:   After the risks benefits and alternatives of the procedure were thoroughly explained, informed consent was obtained.  The digital rectal exam revealed no abnormalities of the rectum.   The LB [id number]  endoscope was introduced through the anus and advanced to the cecum, which was identified by both the appendix and ileocecal valve. No adverse events experienced.   The quality of the prep was good.  The instrument was then slowly withdrawn as the colon was fully examined. Estimated blood loss is zero unless otherwise noted in this procedure report.   COLON FINDINGS: A 93mm sessile polyp was noted in the transverse colon and removed via cold snare.  Two sessile transverse polyps roughly 39mm in size were removed with cold forceps.  Severe diverticulosis was noted in the sigmoid colon.  The remainder of the examined colon was normal.  Retroflexed views revealed internal hemorrhoids. The time to cecum = 5.1  Withdrawal time = 14.2   The scope was withdrawn and the procedure completed. COMPLICATIONS: There were no immediate  complications.  ENDOSCOPIC IMPRESSION: 3 small polyps removed as outlined above Severe sigmoid diverticulosis Internal hemorrhoids  RECOMMENDATIONS: Await pathology results Hold aspirin / NSAIDs for 2 weeks Resume diet Resume other medications Follow up in the GI clinic for hemorrhoid treatment as needed if you have symptoms from hemorrhoids eSigned:  Yetta Flock, MD 01/30/2015 9:41 AM revise cc:  Shanon Ace MD, the patient

## 2015-01-30 NOTE — Patient Instructions (Signed)
  AVOID ASPIRIN, ASPIRIN PRODUCTS AND NSAIDS FOR TWO WEEKS, January 4,2017.   YOU HAD AN ENDOSCOPIC PROCEDURE TODAY AT [facility]:   Refer to the procedure report that was given to you for any specific questions about what was found during the examination.  If the procedure report does not answer your questions, please call your gastroenterologist to clarify.  If you requested that your care partner not be given the details of your procedure findings, then the procedure report has been included in a sealed envelope for you to review at your convenience later.  YOU SHOULD EXPECT: Some feelings of bloating in the abdomen. Passage of more gas than usual.  Walking can help get rid of the air that was put into your GI tract during the procedure and reduce the bloating. If you had a lower endoscopy (such as a colonoscopy or flexible sigmoidoscopy) you may notice spotting of blood in your stool or on the toilet paper. If you underwent a bowel prep for your procedure, you may not have a normal bowel movement for a few days.  Please Note:  You might notice some irritation and congestion in your nose or some drainage.  This is from the oxygen used during your procedure.  There is no need for concern and it should clear up in a day or so.  SYMPTOMS TO REPORT IMMEDIATELY:   Following lower endoscopy (colonoscopy or flexible sigmoidoscopy):  Excessive amounts of blood in the stool  Significant tenderness or worsening of abdominal pains  Swelling of the abdomen that is new, acute  Fever of 100F or higher  For urgent or emergent issues, a gastroenterologist can be reached at any hour by calling [phone].   DIET: Your first meal following the procedure should be a small meal and then it is ok to progress to your normal diet. Heavy or fried foods are harder to digest and may make you feel nauseous or bloated.  Likewise, meals heavy in dairy and vegetables can increase bloating.  Drink  plenty of fluids but you should avoid alcoholic beverages for 24 hours.  ACTIVITY:  You should plan to take it easy for the rest of today and you should NOT DRIVE or use heavy machinery until tomorrow (because of the sedation medicines used during the test).    FOLLOW UP: Our staff will call the number listed on your records the next business day following your procedure to check on you and address any questions or concerns that you may have regarding the information given to you following your procedure. If we do not reach you, we will leave a message.  However, if you are feeling well and you are not experiencing any problems, there is no need to return our call.  We will assume that you have returned to your regular daily activities without incident.  If any biopsies were taken you will be contacted by phone or by letter within the next 1-3 weeks.  Please call us at [phone] if you have not heard about the biopsies in 3 weeks.    SIGNATURES/CONFIDENTIALITY: You and/or your care partner have signed paperwork which will be entered into your electronic medical record.  These signatures attest to the fact that that the information above on your After Visit Summary has been reviewed and is understood.  Full responsibility of the confidentiality of this discharge information lies with you and/or your care-partner.

## 2015-01-30 NOTE — Progress Notes (Signed)
To recovery, report to Tyrell, RN, VSS. 

## 2015-01-31 ENCOUNTER — Telehealth: Payer: Self-pay | Admitting: *Deleted

## 2015-01-31 NOTE — Telephone Encounter (Signed)
  Follow up Call-  Call back number 01/30/2015  Post procedure Call Back phone  # [phone]  Permission to leave phone message Yes     Patient questions:  Do you have a fever, pain , or abdominal swelling? No. Pain Score  0 *  Have you tolerated food without any problems? Yes.    Have you been able to return to your normal activities? Yes.    Do you have any questions about your discharge instructions: Diet   No. Medications  No. Follow up visit  No.  Do you have questions or concerns about your Care? No.  Actions: * If pain score is 4 or above: No action needed, pain <4.

## 2015-02-06 ENCOUNTER — Encounter: Payer: Self-pay | Admitting: Gastroenterology

## 2015-05-21 IMAGING — CR DG CHEST 2V
2 series · 2 of 2 positions shown · non-contrast
Comparison: None

CLINICAL DATA: Right lower quadrant and pelvic pain, fever, recent
surgery for kidney stone, history hiatal hernia

EXAM:
CHEST  2 VIEW

[w chest pa]
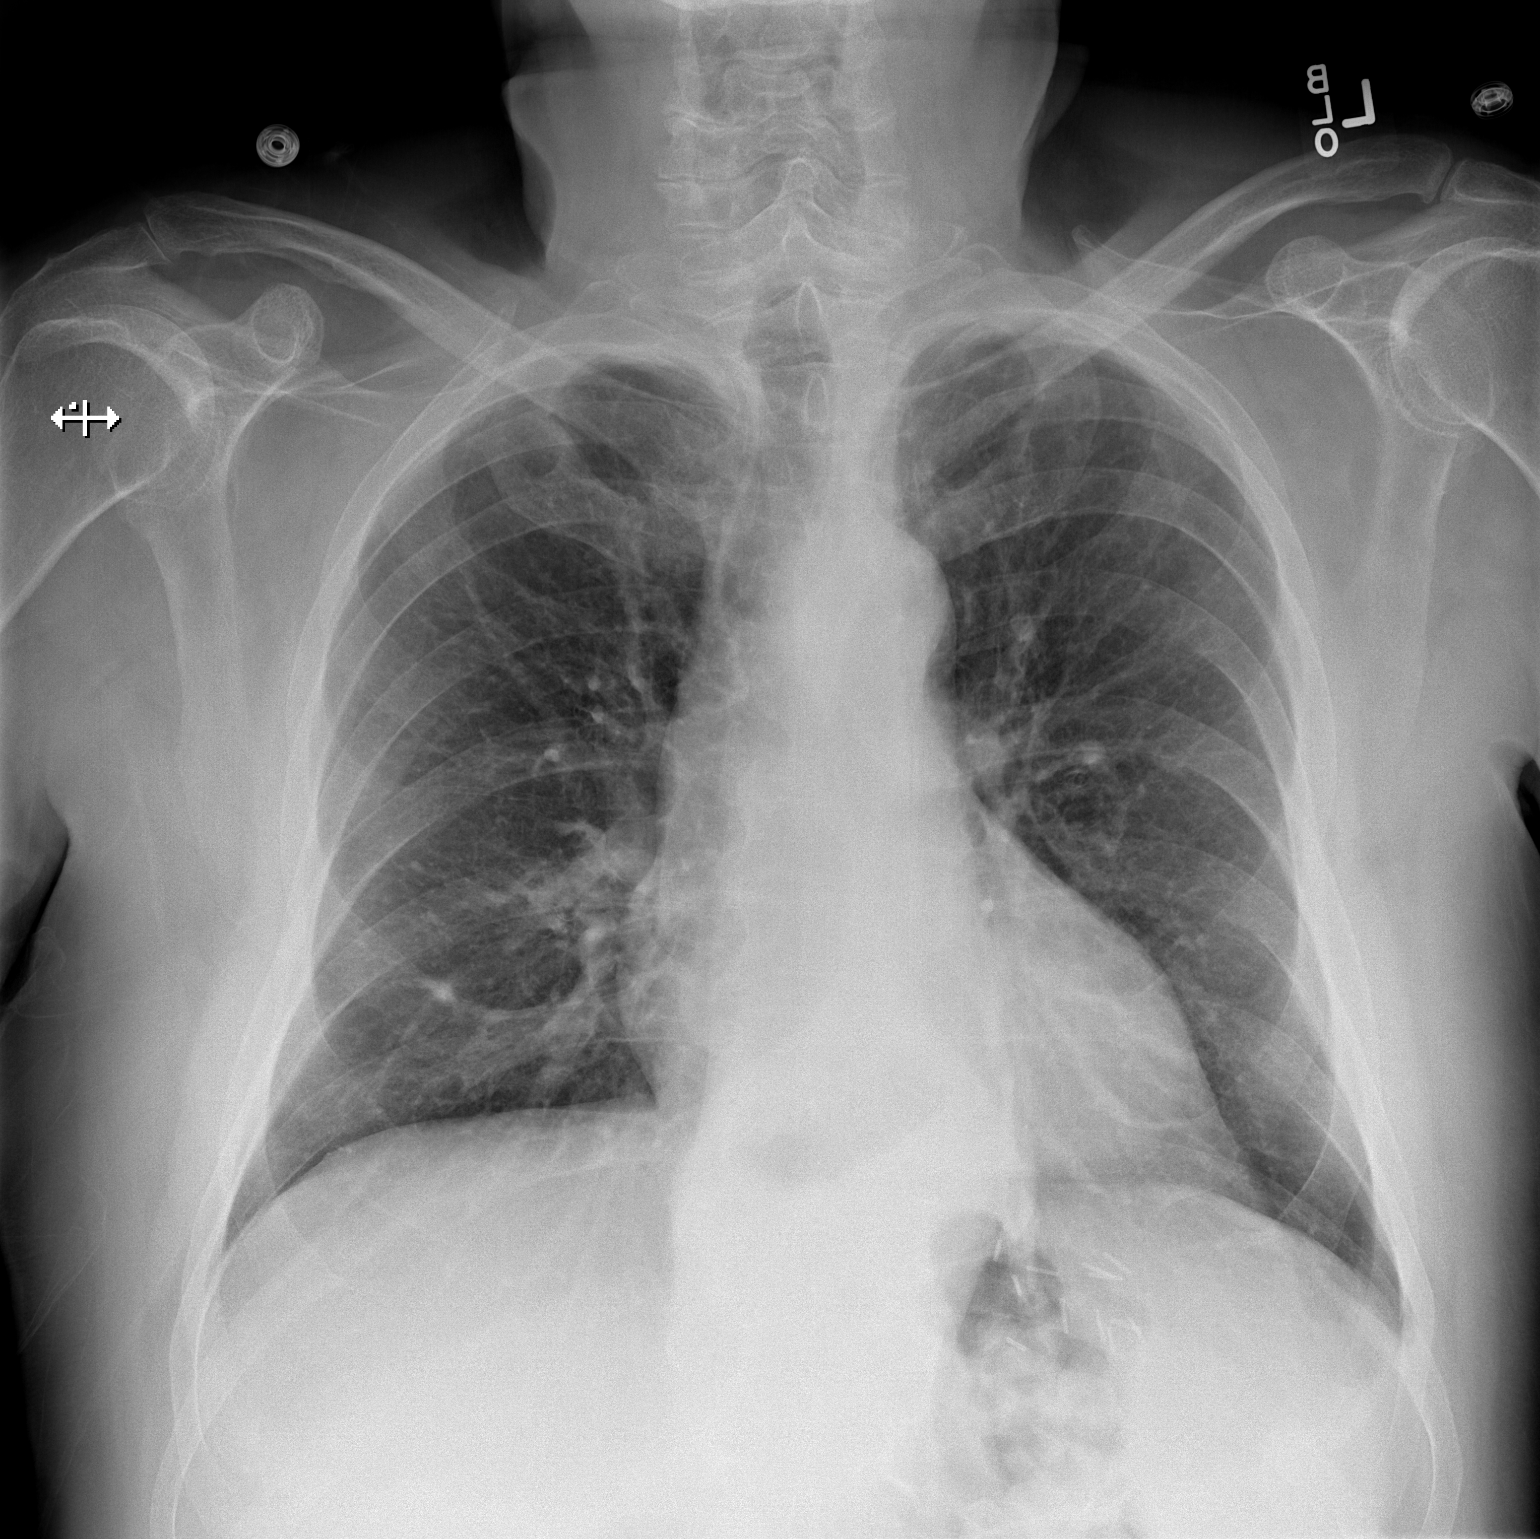

[w chest lat]
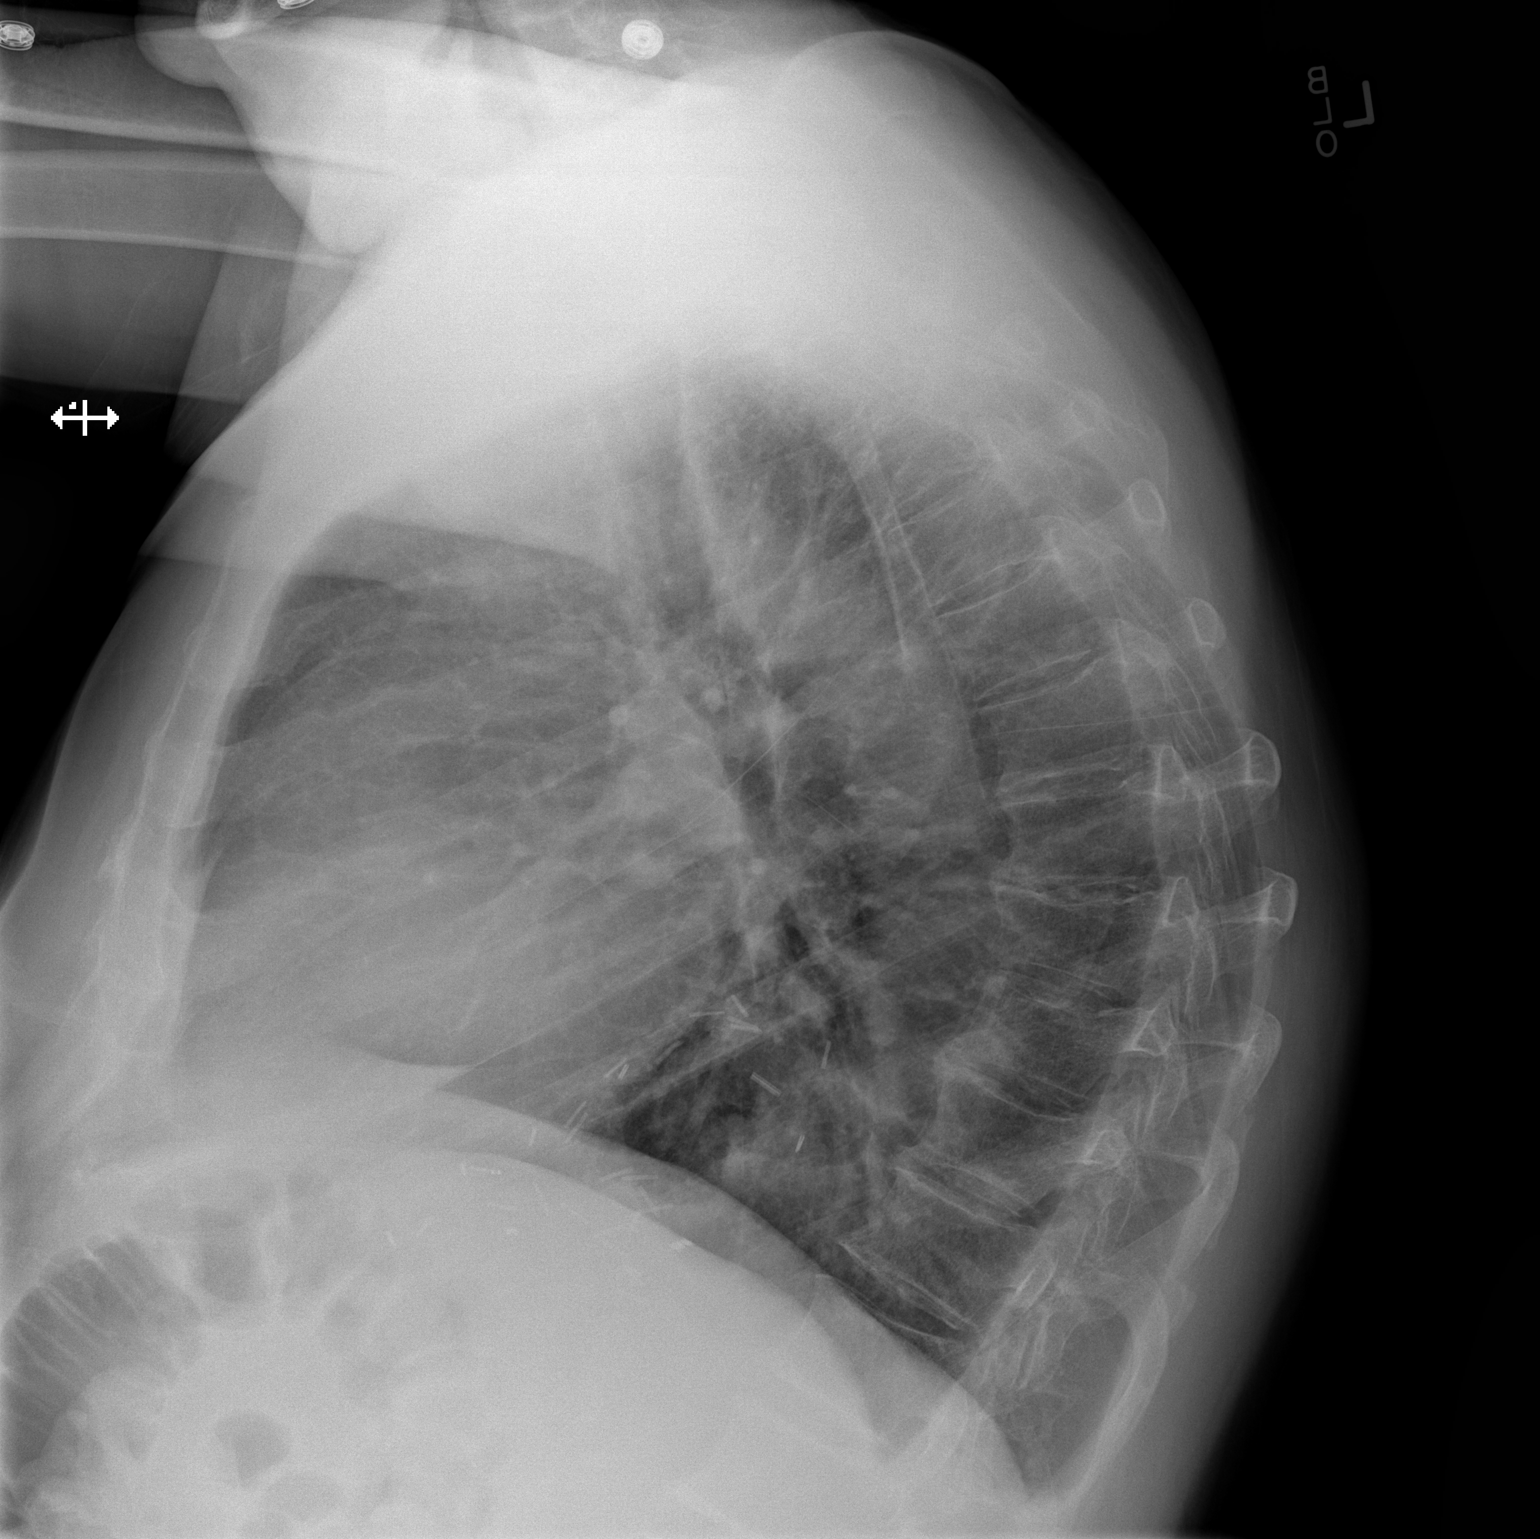

[2 of 2 positions shown; findings below may reference images not displayed]

FINDINGS: Borderline enlargement of cardiac silhouette.

Moderate-sized hiatal hernia.

Numerous surgical clips adjacent to hiatal hernia and proximal
stomach.

Mediastinal contours and pulmonary vascularity otherwise normal.

Peribronchial thickening without infiltrate, pleural effusion or
pneumothorax.

Bones demineralized.

Mild superior endplate compression deformity of a mid to lower
thoracic vertebra, appears old.
IMPRESSION: Bronchitic changes.

Borderline enlargement of cardiac silhouette.

Moderate-sized hiatal hernia.

## 2020-01-19 ENCOUNTER — Encounter: Payer: Self-pay | Admitting: Gastroenterology

## 2020-02-18 DIAGNOSIS — G4733 Obstructive sleep apnea (adult) (pediatric): Secondary | ICD-10-CM | POA: Diagnosis not present

## 2020-03-20 DIAGNOSIS — G4733 Obstructive sleep apnea (adult) (pediatric): Secondary | ICD-10-CM | POA: Diagnosis not present

## 2020-04-16 DIAGNOSIS — G4733 Obstructive sleep apnea (adult) (pediatric): Secondary | ICD-10-CM | POA: Diagnosis not present

## 2020-04-17 DIAGNOSIS — G4733 Obstructive sleep apnea (adult) (pediatric): Secondary | ICD-10-CM | POA: Diagnosis not present

## 2020-04-18 ENCOUNTER — Encounter: Payer: Self-pay | Admitting: Gastroenterology

## 2020-04-18 DIAGNOSIS — Z0000 Encounter for general adult medical examination without abnormal findings: Secondary | ICD-10-CM | POA: Diagnosis not present

## 2020-04-18 DIAGNOSIS — E7800 Pure hypercholesterolemia, unspecified: Secondary | ICD-10-CM | POA: Diagnosis not present

## 2020-04-18 DIAGNOSIS — Z1211 Encounter for screening for malignant neoplasm of colon: Secondary | ICD-10-CM | POA: Diagnosis not present

## 2020-04-18 DIAGNOSIS — Z1389 Encounter for screening for other disorder: Secondary | ICD-10-CM | POA: Diagnosis not present

## 2020-04-18 DIAGNOSIS — G4733 Obstructive sleep apnea (adult) (pediatric): Secondary | ICD-10-CM | POA: Diagnosis not present

## 2020-05-18 DIAGNOSIS — G4733 Obstructive sleep apnea (adult) (pediatric): Secondary | ICD-10-CM | POA: Diagnosis not present

## 2020-06-17 DIAGNOSIS — G4733 Obstructive sleep apnea (adult) (pediatric): Secondary | ICD-10-CM | POA: Diagnosis not present

## 2020-06-19 DIAGNOSIS — L821 Other seborrheic keratosis: Secondary | ICD-10-CM | POA: Diagnosis not present

## 2020-06-19 DIAGNOSIS — C44519 Basal cell carcinoma of skin of other part of trunk: Secondary | ICD-10-CM | POA: Diagnosis not present

## 2020-06-19 DIAGNOSIS — D485 Neoplasm of uncertain behavior of skin: Secondary | ICD-10-CM | POA: Diagnosis not present

## 2020-06-19 DIAGNOSIS — D229 Melanocytic nevi, unspecified: Secondary | ICD-10-CM | POA: Diagnosis not present

## 2020-07-16 DIAGNOSIS — G4733 Obstructive sleep apnea (adult) (pediatric): Secondary | ICD-10-CM | POA: Diagnosis not present

## 2020-07-16 DIAGNOSIS — C44519 Basal cell carcinoma of skin of other part of trunk: Secondary | ICD-10-CM | POA: Diagnosis not present

## 2020-07-16 DIAGNOSIS — L905 Scar conditions and fibrosis of skin: Secondary | ICD-10-CM | POA: Diagnosis not present

## 2020-07-17 DIAGNOSIS — S20469A Insect bite (nonvenomous) of unspecified back wall of thorax, initial encounter: Secondary | ICD-10-CM | POA: Diagnosis not present

## 2020-07-17 DIAGNOSIS — A932 Colorado tick fever: Secondary | ICD-10-CM | POA: Diagnosis not present

## 2020-07-18 DIAGNOSIS — G4733 Obstructive sleep apnea (adult) (pediatric): Secondary | ICD-10-CM | POA: Diagnosis not present

## 2020-08-02 DIAGNOSIS — M791 Myalgia, unspecified site: Secondary | ICD-10-CM | POA: Diagnosis not present

## 2020-08-02 DIAGNOSIS — A932 Colorado tick fever: Secondary | ICD-10-CM | POA: Diagnosis not present

## 2020-08-02 DIAGNOSIS — R519 Headache, unspecified: Secondary | ICD-10-CM | POA: Diagnosis not present

## 2020-08-02 DIAGNOSIS — R509 Fever, unspecified: Secondary | ICD-10-CM | POA: Diagnosis not present

## 2020-08-17 DIAGNOSIS — G4733 Obstructive sleep apnea (adult) (pediatric): Secondary | ICD-10-CM | POA: Diagnosis not present

## 2020-09-17 DIAGNOSIS — Z9989 Dependence on other enabling machines and devices: Secondary | ICD-10-CM | POA: Diagnosis not present

## 2020-09-17 DIAGNOSIS — G4733 Obstructive sleep apnea (adult) (pediatric): Secondary | ICD-10-CM | POA: Diagnosis not present

## 2020-09-17 DIAGNOSIS — J343 Hypertrophy of nasal turbinates: Secondary | ICD-10-CM | POA: Diagnosis not present

## 2020-09-17 DIAGNOSIS — Z974 Presence of external hearing-aid: Secondary | ICD-10-CM | POA: Diagnosis not present

## 2020-09-17 DIAGNOSIS — H903 Sensorineural hearing loss, bilateral: Secondary | ICD-10-CM | POA: Diagnosis not present

## 2020-09-19 ENCOUNTER — Ambulatory Visit
Admission: RE | Admit: 2020-09-19 | Discharge: 2020-09-19 | Disposition: A | Discharge status: Home / Self Care | Payer: Medicare Other | Source: Ambulatory Visit | Attending: Physician Assistant | Admitting: Physician Assistant

## 2020-09-19 ENCOUNTER — Other Ambulatory Visit: Payer: Self-pay | Admitting: Physician Assistant

## 2020-09-19 DIAGNOSIS — R61 Generalized hyperhidrosis: Secondary | ICD-10-CM

## 2020-09-23 DIAGNOSIS — R61 Generalized hyperhidrosis: Secondary | ICD-10-CM | POA: Diagnosis not present

## 2020-10-18 DIAGNOSIS — G4733 Obstructive sleep apnea (adult) (pediatric): Secondary | ICD-10-CM | POA: Diagnosis not present

## 2020-11-17 DIAGNOSIS — G4733 Obstructive sleep apnea (adult) (pediatric): Secondary | ICD-10-CM | POA: Diagnosis not present

## 2020-12-12 DIAGNOSIS — Z23 Encounter for immunization: Secondary | ICD-10-CM | POA: Diagnosis not present

## 2020-12-20 DIAGNOSIS — G4733 Obstructive sleep apnea (adult) (pediatric): Secondary | ICD-10-CM | POA: Diagnosis not present

## 2021-01-20 DIAGNOSIS — G4733 Obstructive sleep apnea (adult) (pediatric): Secondary | ICD-10-CM | POA: Diagnosis not present

## 2021-02-25 DIAGNOSIS — D1801 Hemangioma of skin and subcutaneous tissue: Secondary | ICD-10-CM | POA: Diagnosis not present

## 2021-02-25 DIAGNOSIS — L821 Other seborrheic keratosis: Secondary | ICD-10-CM | POA: Diagnosis not present

## 2021-02-25 DIAGNOSIS — R208 Other disturbances of skin sensation: Secondary | ICD-10-CM | POA: Diagnosis not present

## 2021-02-25 DIAGNOSIS — L538 Other specified erythematous conditions: Secondary | ICD-10-CM | POA: Diagnosis not present

## 2021-02-25 DIAGNOSIS — L298 Other pruritus: Secondary | ICD-10-CM | POA: Diagnosis not present

## 2021-02-25 DIAGNOSIS — Z789 Other specified health status: Secondary | ICD-10-CM | POA: Diagnosis not present

## 2021-02-25 DIAGNOSIS — L820 Inflamed seborrheic keratosis: Secondary | ICD-10-CM | POA: Diagnosis not present

## 2021-02-25 DIAGNOSIS — L814 Other melanin hyperpigmentation: Secondary | ICD-10-CM | POA: Diagnosis not present

## 2021-04-26 DIAGNOSIS — G4733 Obstructive sleep apnea (adult) (pediatric): Secondary | ICD-10-CM | POA: Diagnosis not present

## 2021-05-01 DIAGNOSIS — Z5181 Encounter for therapeutic drug level monitoring: Secondary | ICD-10-CM | POA: Diagnosis not present

## 2021-05-01 DIAGNOSIS — Z23 Encounter for immunization: Secondary | ICD-10-CM | POA: Diagnosis not present

## 2021-05-01 DIAGNOSIS — Z0000 Encounter for general adult medical examination without abnormal findings: Secondary | ICD-10-CM | POA: Diagnosis not present

## 2021-05-01 DIAGNOSIS — E7800 Pure hypercholesterolemia, unspecified: Secondary | ICD-10-CM | POA: Diagnosis not present

## 2021-06-05 DIAGNOSIS — S70361A Insect bite (nonvenomous), right thigh, initial encounter: Secondary | ICD-10-CM | POA: Diagnosis not present

## 2021-07-25 DIAGNOSIS — G4733 Obstructive sleep apnea (adult) (pediatric): Secondary | ICD-10-CM | POA: Diagnosis not present

## 2021-08-14 DIAGNOSIS — L814 Other melanin hyperpigmentation: Secondary | ICD-10-CM | POA: Diagnosis not present

## 2021-08-14 DIAGNOSIS — D1801 Hemangioma of skin and subcutaneous tissue: Secondary | ICD-10-CM | POA: Diagnosis not present

## 2021-08-14 DIAGNOSIS — Z85828 Personal history of other malignant neoplasm of skin: Secondary | ICD-10-CM | POA: Diagnosis not present

## 2021-08-14 DIAGNOSIS — L821 Other seborrheic keratosis: Secondary | ICD-10-CM | POA: Diagnosis not present

## 2021-08-14 DIAGNOSIS — Z08 Encounter for follow-up examination after completed treatment for malignant neoplasm: Secondary | ICD-10-CM | POA: Diagnosis not present

## 2021-09-18 DIAGNOSIS — M7021 Olecranon bursitis, right elbow: Secondary | ICD-10-CM | POA: Diagnosis not present

## 2021-10-24 DIAGNOSIS — G4733 Obstructive sleep apnea (adult) (pediatric): Secondary | ICD-10-CM | POA: Diagnosis not present

## 2021-11-10 ENCOUNTER — Encounter: Payer: Self-pay | Admitting: Gastroenterology

## 2022-01-05 ENCOUNTER — Telehealth: Payer: Self-pay | Admitting: *Deleted

## 2022-01-05 NOTE — Telephone Encounter (Signed)
Dr. Havery Moros,  This pt is 76 yo.  He has a recall assessment from March 2022.  If a pt is greater than 75 and their recall assessment sheet is greater than a year old, we are to check to make sure ok to proceed still.  Please advise,  Thanks, Cyril Mourning

## 2022-01-05 NOTE — Telephone Encounter (Signed)
He does not appear to have any high risk comorbidities, with his history of polyps and age 76 I think 1 more exam is reasonable if he wants to proceed with that.  I think okay to direct booked in the [facility] if he is comfortable with that, if not and he wants to see me in clinic first that is fine too.  Thanks

## 2022-01-05 NOTE — Telephone Encounter (Signed)
noted 

## 2022-01-09 ENCOUNTER — Ambulatory Visit (AMBULATORY_SURGERY_CENTER): Payer: Medicare Other | Admitting: *Deleted

## 2022-01-09 VITALS — Ht 75.0 in | Wt 219.0 lb

## 2022-01-09 DIAGNOSIS — Z86010 Personal history of colonic polyps: Secondary | ICD-10-CM

## 2022-01-09 MED ORDER — NA SULFATE-K SULFATE-MG SULF 17.5-3.13-1.6 GM/177ML PO SOLN: 1.0000 | Freq: Once | ORAL | 0 refills | Status: AC

## 2022-01-09 NOTE — Progress Notes (Signed)

## 2022-01-22 DIAGNOSIS — G4733 Obstructive sleep apnea (adult) (pediatric): Secondary | ICD-10-CM | POA: Diagnosis not present

## 2022-01-27 ENCOUNTER — Encounter: Payer: Self-pay | Admitting: Gastroenterology

## 2022-01-29 ENCOUNTER — Ambulatory Visit (AMBULATORY_SURGERY_CENTER): Payer: Medicare Other | Admitting: Gastroenterology

## 2022-01-29 ENCOUNTER — Encounter: Payer: Self-pay | Admitting: Gastroenterology

## 2022-01-29 VITALS — BP 122/74 | HR 61 | Temp 98.6°F | Resp 14 | Ht 75.0 in | Wt 219.0 lb

## 2022-01-29 DIAGNOSIS — D122 Benign neoplasm of ascending colon: Secondary | ICD-10-CM

## 2022-01-29 DIAGNOSIS — G4730 Sleep apnea, unspecified: Secondary | ICD-10-CM | POA: Diagnosis not present

## 2022-01-29 DIAGNOSIS — D120 Benign neoplasm of cecum: Secondary | ICD-10-CM | POA: Diagnosis not present

## 2022-01-29 DIAGNOSIS — D123 Benign neoplasm of transverse colon: Secondary | ICD-10-CM

## 2022-01-29 DIAGNOSIS — D128 Benign neoplasm of rectum: Secondary | ICD-10-CM

## 2022-01-29 DIAGNOSIS — D124 Benign neoplasm of descending colon: Secondary | ICD-10-CM

## 2022-01-29 DIAGNOSIS — Z86010 Personal history of colonic polyps: Secondary | ICD-10-CM | POA: Diagnosis not present

## 2022-01-29 DIAGNOSIS — K635 Polyp of colon: Secondary | ICD-10-CM | POA: Diagnosis not present

## 2022-01-29 DIAGNOSIS — I493 Ventricular premature depolarization: Secondary | ICD-10-CM | POA: Diagnosis not present

## 2022-01-29 DIAGNOSIS — Z09 Encounter for follow-up examination after completed treatment for conditions other than malignant neoplasm: Secondary | ICD-10-CM

## 2022-01-29 MED ORDER — SODIUM CHLORIDE 0.9 % IV SOLN: 500.0000 mL | Freq: Once | INTRAVENOUS | Status: DC

## 2022-01-29 NOTE — Progress Notes (Signed)
VSS, transported to PACU °

## 2022-01-29 NOTE — Progress Notes (Signed)
Pt's states no medical or surgical changes since previsit or office visit. 

## 2022-01-29 NOTE — Patient Instructions (Signed)
Handouts on polyps and diverticulosis given to you today  Await pathology from Dr. Havery Moros   YOU HAD AN ENDOSCOPIC PROCEDURE TODAY AT THE [facility]:   Refer to the procedure report that was given to you for any specific questions about what was found during the examination.  If the procedure report does not answer your questions, please call your gastroenterologist to clarify.  If you requested that your care partner not be given the details of your procedure findings, then the procedure report has been included in a sealed envelope for you to review at your convenience later.  YOU SHOULD EXPECT: Some feelings of bloating in the abdomen. Passage of more gas than usual.  Walking can help get rid of the air that was put into your GI tract during the procedure and reduce the bloating. If you had a lower endoscopy (such as a colonoscopy or flexible sigmoidoscopy) you may notice spotting of blood in your stool or on the toilet paper. If you underwent a bowel prep for your procedure, you may not have a normal bowel movement for a few days.  Please Note:  You might notice some irritation and congestion in your nose or some drainage.  This is from the oxygen used during your procedure.  There is no need for concern and it should clear up in a day or so.  SYMPTOMS TO REPORT IMMEDIATELY:  Following lower endoscopy (colonoscopy or flexible sigmoidoscopy):  Excessive amounts of blood in the stool  Significant tenderness or worsening of abdominal pains  Swelling of the abdomen that is new, acute  Fever of 100F or higher  For urgent or emergent issues, a gastroenterologist can be reached at any hour by calling [phone]. Do not use MyChart messaging for urgent concerns.    DIET:  We do recommend a small meal at first, but then you may proceed to your regular diet.  Drink plenty of fluids but you should avoid alcoholic beverages for 24 hours.  ACTIVITY:  You should plan to take it  easy for the rest of today and you should NOT DRIVE or use heavy machinery until tomorrow (because of the sedation medicines used during the test).    FOLLOW UP: Our staff will call the number listed on your records the next business day following your procedure.  We will call around 7:15- 8:00 am to check on you and address any questions or concerns that you may have regarding the information given to you following your procedure. If we do not reach you, we will leave a message.     If any biopsies were taken you will be contacted by phone or by letter within the next 1-3 weeks.  Please call us at [phone] if you have not heard about the biopsies in 3 weeks.    SIGNATURES/CONFIDENTIALITY: You and/or your care partner have signed paperwork which will be entered into your electronic medical record.  These signatures attest to the fact that that the information above on your After Visit Summary has been reviewed and is understood.  Full responsibility of the confidentiality of this discharge information lies with you and/or your care-partner.

## 2022-01-29 NOTE — Op Note (Signed)
[place] Patient Name: Shawn Graham Procedure Date: 01/29/2022 8:35 AM MRN: [id number] Endoscopist: Remo Lipps P. Havery Moros , MD, [id number] Age: 76 Referring MD:  Date of Birth: [birth year] Gender: Male Account #: [id number] Procedure:                Colonoscopy Indications:              High risk colon cancer surveillance: Personal                            history of colonic polyps - 2 adenomas removed                            01/2015 Medicines:                Monitored Anesthesia Care Procedure:                Pre-Anesthesia Assessment:                           - Prior to the procedure, a History and Physical                            was performed, and patient medications and                            allergies were reviewed. The patient's tolerance of                            previous anesthesia was also reviewed. The risks                            and benefits of the procedure and the sedation                            options and risks were discussed with the patient.                            All questions were answered, and informed consent                            was obtained. Prior Anticoagulants: The patient has                            taken no anticoagulant or antiplatelet agents. ASA                            Grade Assessment: II - A patient with mild systemic                            disease. After reviewing the risks and benefits,                            the patient was deemed in satisfactory condition to  undergo the procedure.                           After obtaining informed consent, the colonoscope                            was passed under direct vision. Throughout the                            procedure, the patient's blood pressure, pulse, and                            oxygen saturations were monitored continuously. The                            CF [id number] #[id number] was introduced through the anus                             and advanced to the the cecum, identified by                            appendiceal orifice and ileocecal valve. The                            colonoscopy was technically difficult and complex                            due to a redundant colon. The patient tolerated the                            procedure well. The quality of the bowel                            preparation was good. The ileocecal valve,                            appendiceal orifice, and rectum were photographed. Scope In: 8:39:01 AM Scope Out: 9:12:28 AM Scope Withdrawal Time: 0 hours 20 minutes 37 seconds  Total Procedure Duration: 0 hours 33 minutes 27 seconds  Findings:                 The perianal and digital rectal examinations were                            normal.                           Many medium-mouthed diverticula were found in the                            left colon.                           A 5 to 6 mm polyp was found in the cecum. The polyp  was flat. The polyp was removed with a cold snare.                            Resection and retrieval were complete.                           A 10 to 12 mm polypoid lesion was found in the                            ileocecal valve. The area was sessile - this may be                            benign lipomatous change of the valve however the                            area was removed with a cold snare. Resection and                            retrieval were complete.                           A 3 mm polyp was found in the ascending colon. The                            polyp was sessile. The polyp was removed with a                            cold snare. Resection and retrieval were complete.                           Three flat and sessile polyps were found in the                            transverse colon. The polyps were 3 to 4 mm in                            size. These polyps were removed with a  cold snare.                            Resection and retrieval were complete.                           A 3 mm polyp was found in the descending colon. The                            polyp was sessile. The polyp was removed with a                            cold snare. Resection and retrieval were complete.                           A 7  to 8 mm polyp was found in the proximal rectum.                            The polyp was semi-sessile. The polyp was removed                            with a cold snare. Resection and retrieval were                            complete.                           Internal hemorrhoids were found during retroflexion.                           The colon was redundant and revealed excessive                            looping. Abdominal pressure and positional changes                            utilized to achieve cecal intubation, which was                            challenging.                           The exam was otherwise without abnormality. Complications:            No immediate complications. Estimated blood loss:                            Minimal. Estimated Blood Loss:     Estimated blood loss was minimal. Impression:               - Diverticulosis in the left colon.                           - One 5 to 6 mm polyp in the cecum, removed with a                            cold snare. Resected and retrieved.                           - One 10 to 12 mm polypoid lesion at the ileocecal                            valve, removed with a cold snare. Resected and                            retrieved.                           - One 3 mm polyp in the ascending colon, removed  with a cold snare. Resected and retrieved.                           - Three 3 to 4 mm polyps in the transverse colon,                            removed with a cold snare. Resected and retrieved.                           - One 3 mm polyp in the descending colon,  removed                            with a cold snare. Resected and retrieved.                           - One 7 to 8 mm polyp in the proximal rectum,                            removed with a cold snare. Resected and retrieved.                           - Internal hemorrhoids.                           - There was significant looping of the colon.                           - The examination was otherwise normal. Recommendation:           - Patient has a contact number available for                            emergencies. The signs and symptoms of potential                            delayed complications were discussed with the                            patient. Return to normal activities tomorrow.                            Written discharge instructions were provided to the                            patient.                           - Resume previous diet.                           - Continue present medications.                           - Await pathology results. Remo Lipps P. Havery Moros, MD 01/29/2022 9:20:31 AM This report has been signed  electronically.

## 2022-01-29 NOTE — Progress Notes (Signed)
Called to room to assist during endoscopic procedure.  Patient ID and intended procedure confirmed with present staff. Received instructions for my participation in the procedure from the performing physician.  

## 2022-01-29 NOTE — Progress Notes (Signed)
[facility] History and Physical   Primary Care Physician:  Seward Carol, MD   Reason for Procedure:   History of colon polyps  Plan:    colonoscopy     HPI: Shawn Graham is a 76 y.o. male  here for colonoscopy surveillance - 2 adenomas removed 01/2015.   Patient denies any bowel symptoms at this time. No family history of colon cancer known. Otherwise feels well without any cardiopulmonary symptoms.   I have discussed risks / benefits of anesthesia and endoscopic procedure with [name] and they wish to proceed with the exams as outlined today.    Past Medical History:  Diagnosis Date   Abdominal hernia    Arthritis    SHOULDER   Diverticulosis of colon    GERD (gastroesophageal reflux disease)    none since HH repair   H/O hiatal hernia    History of gastroesophageal reflux (GERD)    NO ISSUES SINCE HH REPAIR   History of kidney stones    Hyperlipidemia    PAC (premature atrial contraction)    hx of PAC"S   Right ureteral stone    Sleep apnea    CPAP   Wears contact lenses     Past Surgical History:  Procedure Laterality Date   CARDIOVASCULAR STRESS TEST  05-20-2000   NORMAL CARDIOLITE STUDY/  NO ISCHEMIA   COLONOSCOPY  06-01-2003   CYSTOSCOPY W/ URETERAL STENT PLACEMENT Right 04/30/2013   Procedure: CYSTOSCOPY WITH RETROGRADE PYELOGRAM/URETERAL STENT PLACEMENT;  Surgeon: Alexis Frock, MD;  Location: [facility];  Service: Urology;  Laterality: Right;  and bladder wall biopsy   CYSTOSCOPY WITH RETROGRADE PYELOGRAM, URETEROSCOPY AND STENT PLACEMENT Right 05/04/2013   Procedure: CYSTOSCOPY WITH RIGHT RETROGRADE PYELOGRAM, URETEROSCOPY AND LASER OF STONE AND Placement  DOUBLE J STENT;  Surgeon: Ailene Rud, MD;  Location: [facility];  Service: Urology;  Laterality: Right;   HIATAL HERNIA REPAIR  1998   HOLMIUM LASER APPLICATION Right 2/97/9892   Procedure: HOLMIUM LASER APPLICATION;  Surgeon: Ailene Rud, MD;   Location: [facility];  Service: Urology;  Laterality: Right;   I  & D  LEFT INDEX FINDER WITH NERVE REPAIRS  12-06-2003   LAPAROSCOPIC NISSEN FUNDOPLICATION  1194   NASAL SINUS SURGERY  X2  LAST ONE 1980'S   REATTACHMENT AND REPAIR RIGHT FOREARM INJURY  AGE 59   RESIDUAL -- LITTLE FINGER NUMBNESS   TONSILLECTOMY  AS CHILD   WRIST SURGERY     age 62     Prior to Admission medications   Medication Sig Start Date End Date Taking? Authorizing Provider  Multiple Vitamin (MULTIVITAMIN WITH MINERALS) TABS tablet Take 1 tablet by mouth daily.   Yes [provider]  tamsulosin (FLOMAX) 0.4 MG CAPS capsule Take 0.4 mg by mouth daily.  04/28/13  Yes [provider]  fluticasone (FLONASE) 50 MCG/ACT nasal spray INSTILL 2 PUFFS EACH NOSTRIL EVERY NIGHT. 11/27/21   [provider]  naproxen sodium (ANAPROX) 220 MG tablet Take 220 mg by mouth as needed.    [provider]    Current Outpatient Medications  Medication Sig Dispense Refill   Multiple Vitamin (MULTIVITAMIN WITH MINERALS) TABS tablet Take 1 tablet by mouth daily.     tamsulosin (FLOMAX) 0.4 MG CAPS capsule Take 0.4 mg by mouth daily.      fluticasone (FLONASE) 50 MCG/ACT nasal spray INSTILL 2 PUFFS EACH NOSTRIL EVERY NIGHT.     naproxen sodium (ANAPROX) 220  MG tablet Take 220 mg by mouth as needed.     Current Facility-Administered Medications  Medication Dose Route Frequency Provider Last Rate Last Admin   0.9 %  sodium chloride infusion  500 mL Intravenous Once [name], Carlota Raspberry, MD        Allergies as of 01/29/2022   (No Known Allergies)    Family History  Problem Relation Age of Onset   Heart disease Mother    Lung cancer Father    Colon cancer Neg Hx    Colon polyps Neg Hx    Esophageal cancer Neg Hx    Rectal cancer Neg Hx    Stomach cancer Neg Hx    Crohn's disease Neg Hx    Ulcerative colitis Neg Hx     Social History   Socioeconomic History   Marital status:  Married    Spouse name: Not on file   Number of children: Not on file   Years of education: Not on file   Highest education level: Not on file  Occupational History   Not on file  Tobacco Use   Smoking status: Former    Packs/day: 1.00    Years: 15.00    Total pack years: 15.00    Types: Cigarettes    Quit date: 05/03/1998    Years since quitting: 23.7    Passive exposure: Never   Smokeless tobacco: Never  Vaping Use   Vaping Use: Never used  Substance and Sexual Activity   Alcohol use: No    Alcohol/week: 0.0 standard drinks of alcohol   Drug use: No   Sexual activity: Not on file  Other Topics Concern   Not on file  Social History Narrative   Not on file   Social Determinants of Health   Financial Resource Strain: Not on file  Food Insecurity: Not on file  Transportation Needs: Not on file  Physical Activity: Not on file  Stress: Not on file  Social Connections: Not on file  Intimate Partner Violence: Not on file    Review of Systems: All other review of systems negative except as mentioned in the HPI.  Physical Exam: Vital signs BP 126/65   Pulse 66   Temp 98.6 F (37 C)   Ht '6\' 3"'$  (1.905 m)   Wt 219 lb (99.3 kg)   SpO2 98%   BMI 27.37 kg/m   General:   Alert,  Well-developed, pleasant and cooperative in NAD Lungs:  Clear throughout to auscultation.   Heart:  Regular rate and rhythm Abdomen:  Soft, nontender and nondistended.   Neuro/Psych:  Alert and cooperative. Normal mood and affect. A and O x 3  [name], MD [facility]

## 2022-01-30 ENCOUNTER — Telehealth: Payer: Self-pay

## 2022-01-30 NOTE — Telephone Encounter (Signed)
No voicemail available on follow up call.

## 2022-10-10 IMAGING — DX DG CHEST 2V
2 series · 2 of 2 positions shown · non-contrast
Comparison: 04/30/2013

CLINICAL DATA: Night sweats.  Recent international travel.

EXAM:
CHEST - 2 VIEW

[dg chest 2 view (1 of 2)]
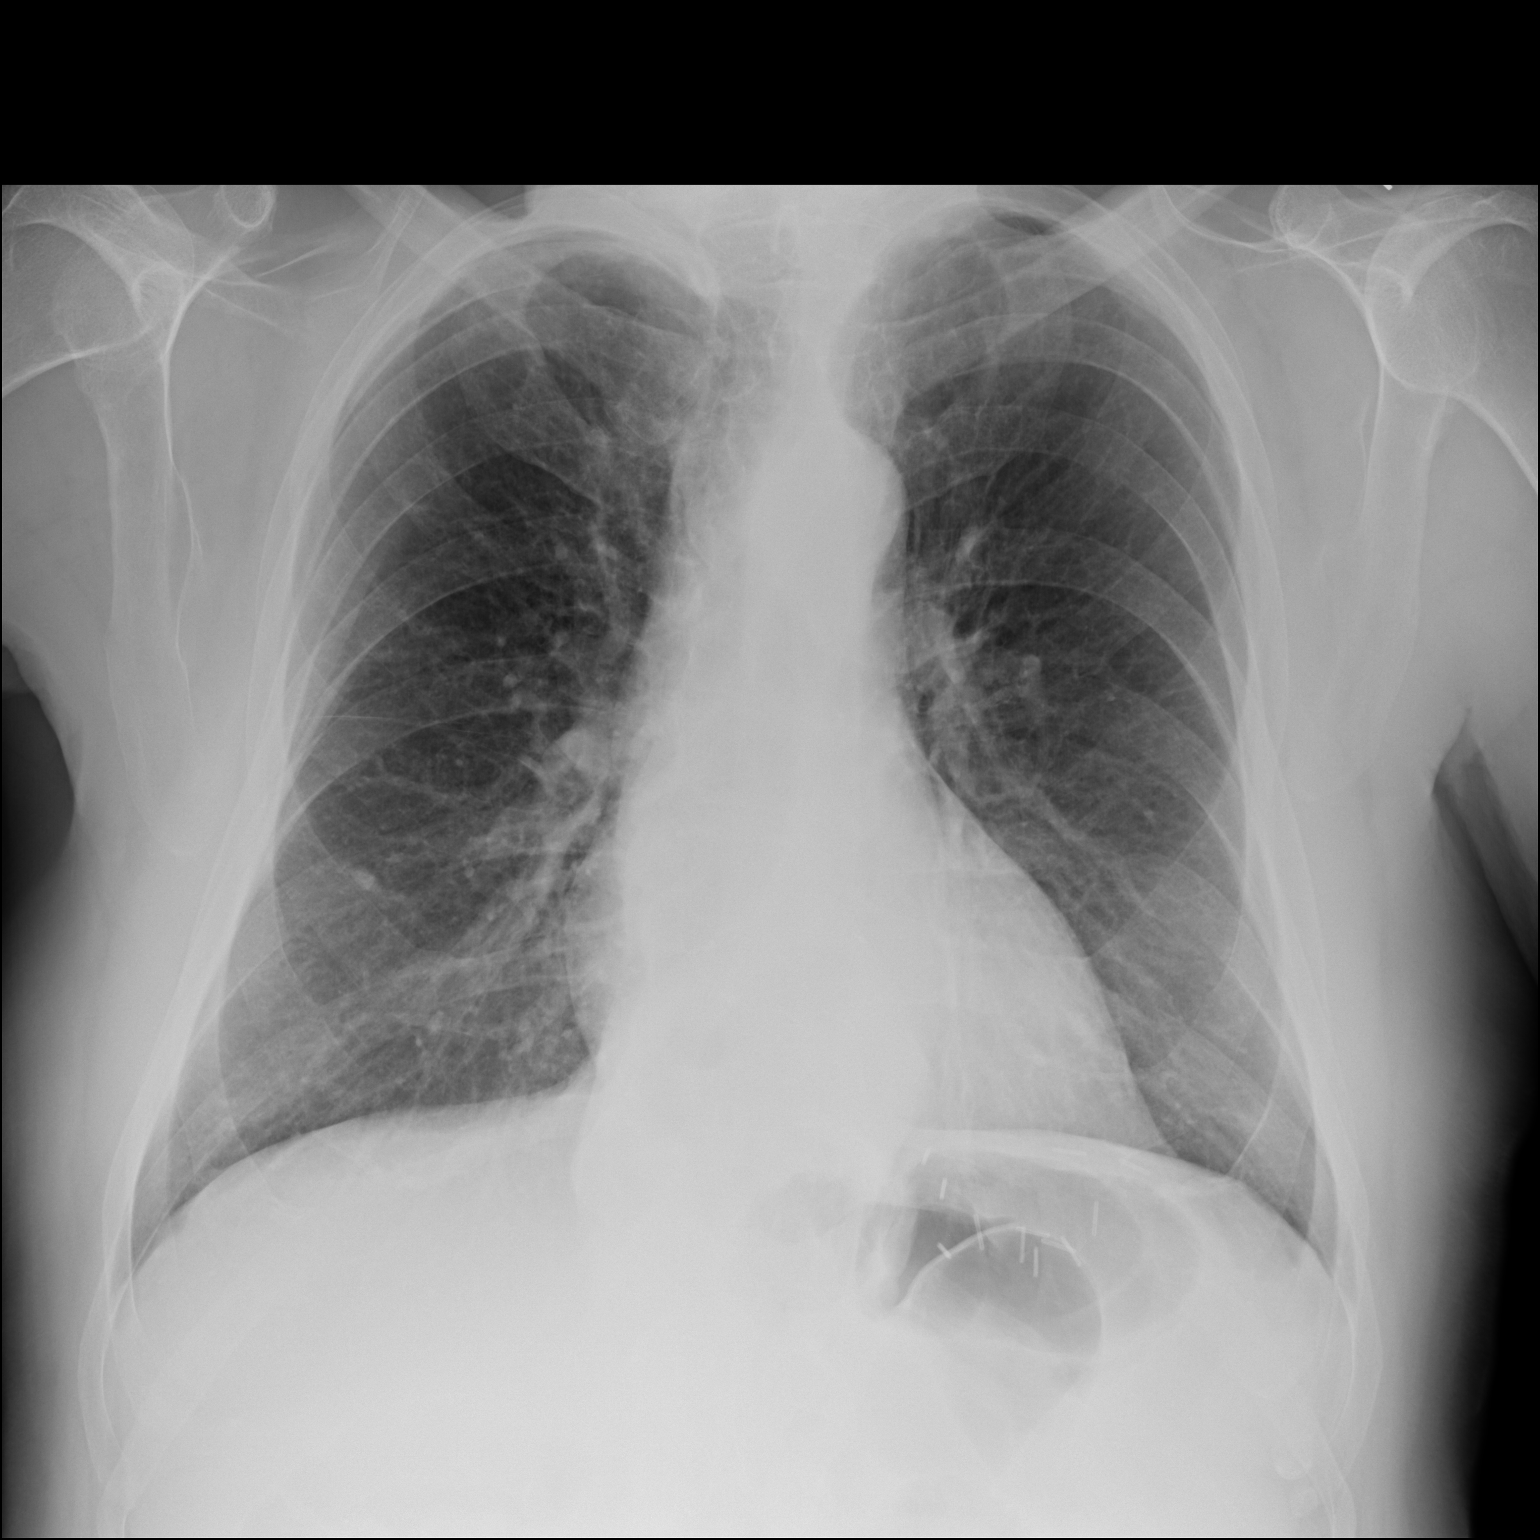

[dg chest 2 view (2 of 2)]
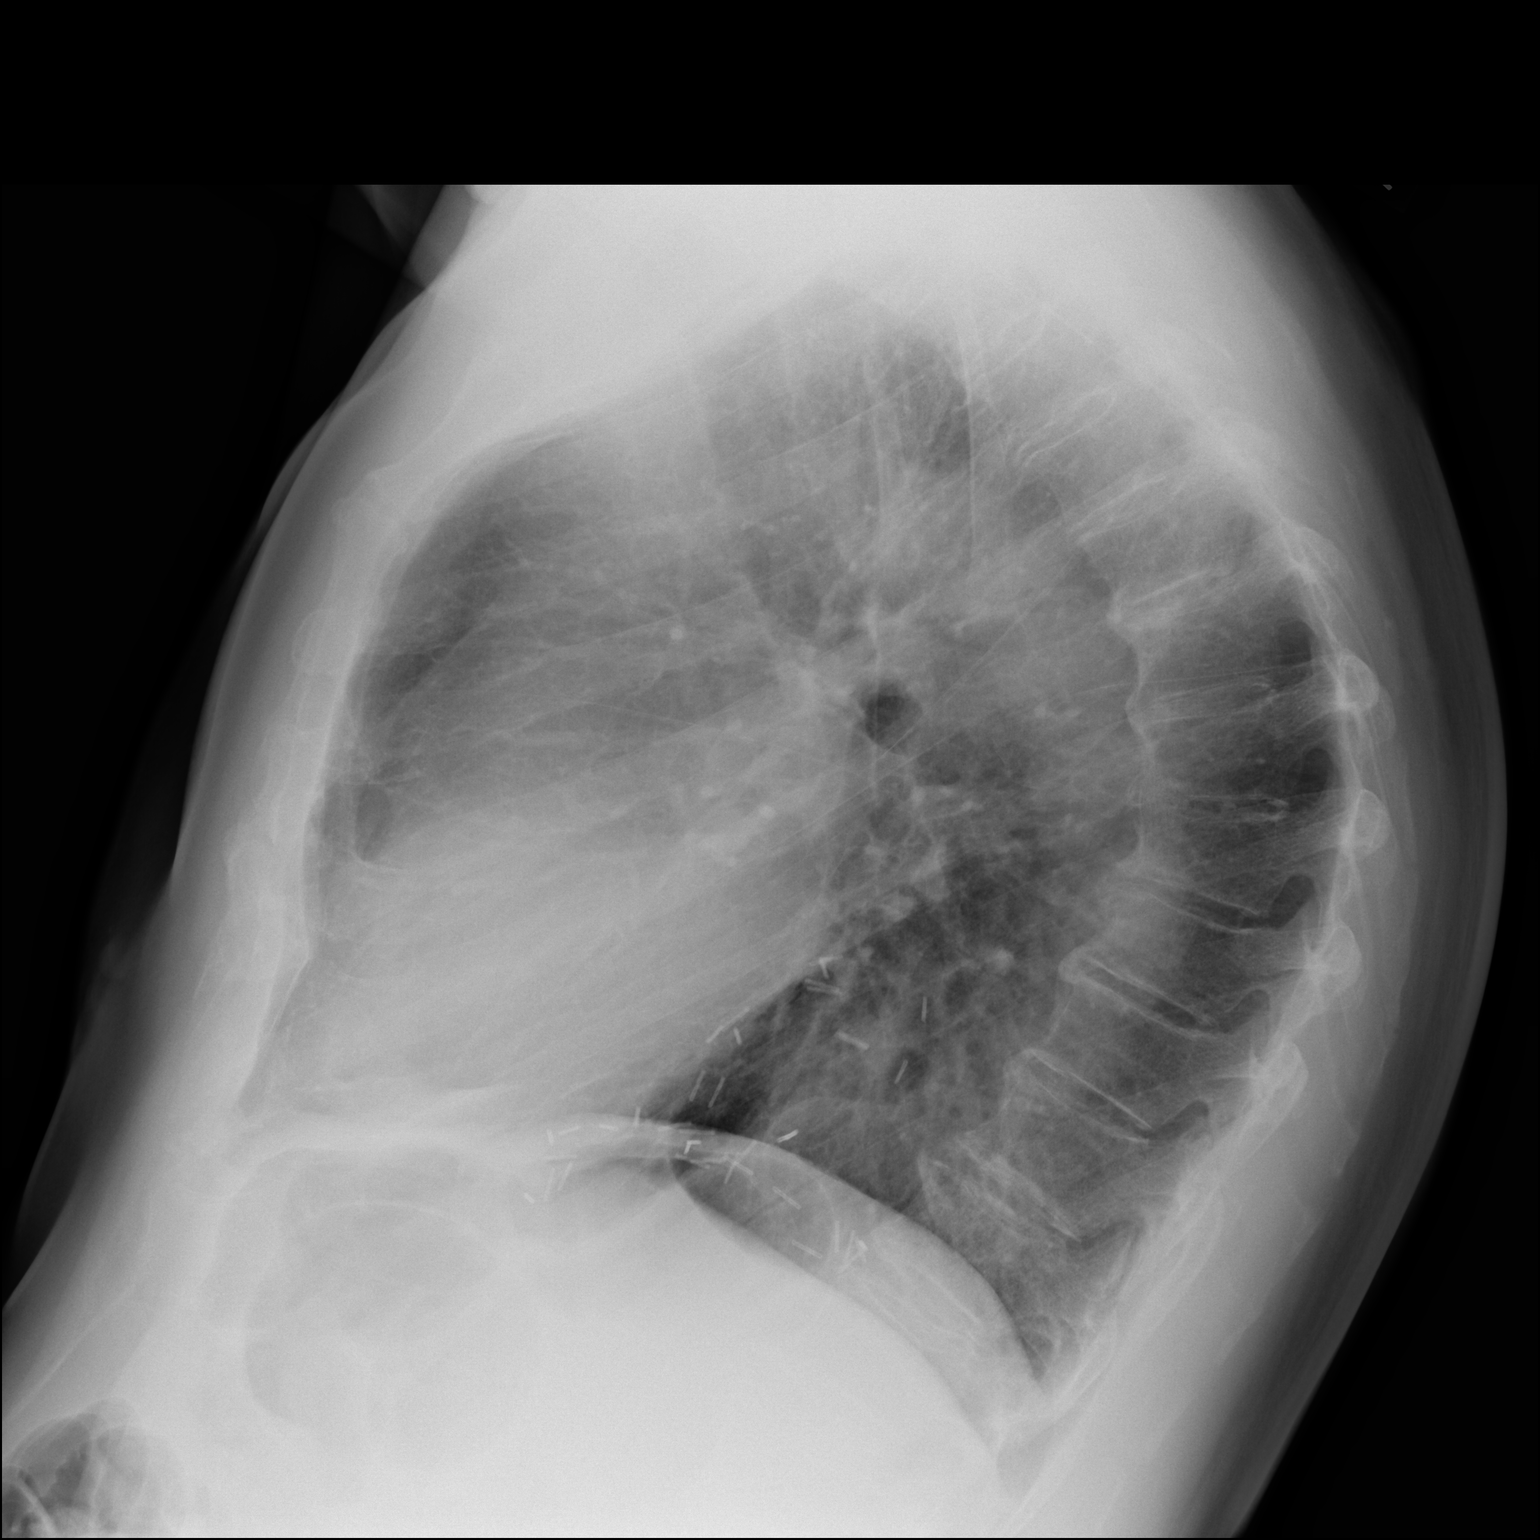

[2 of 2 positions shown; findings below may reference images not displayed]

FINDINGS: The cardiomediastinal silhouette is unchanged with normal heart
size. A moderate-sized hiatal hernia is again seen with numerous
surgical clips adjacent to the hernia and in the left upper abdomen.
No airspace consolidation, edema, pleural effusion, pneumothorax is
identified. A 4 mm nodular density projecting over the right mid to
lower lung is unchanged and likely reflects a calcified granuloma.
No acute osseous abnormality is identified.
IMPRESSION: No active cardiopulmonary disease.

## 2023-10-28 ENCOUNTER — Other Ambulatory Visit: Payer: Self-pay | Admitting: Medical Genetics

## 2023-11-18 ENCOUNTER — Other Ambulatory Visit: Payer: Self-pay

## 2023-11-18 DIAGNOSIS — Z006 Encounter for examination for normal comparison and control in clinical research program: Secondary | ICD-10-CM

## 2023-11-27 LAB — GENECONNECT MOLECULAR SCREEN: Genetic Analysis Overall Interpretation: NEGATIVE
